# Patient Record
Sex: Female | Born: 1950
Health system: Southern US, Community
[De-identification: ages and names within clinical notes are randomized; demographics above are authoritative.]

## PROBLEM LIST (undated history)

## (undated) DIAGNOSIS — R079 Chest pain, unspecified: Secondary | ICD-10-CM

## (undated) DIAGNOSIS — F419 Anxiety disorder, unspecified: Secondary | ICD-10-CM

## (undated) DIAGNOSIS — M19049 Primary osteoarthritis, unspecified hand: Secondary | ICD-10-CM

## (undated) DIAGNOSIS — M8580 Other specified disorders of bone density and structure, unspecified site: Secondary | ICD-10-CM

## (undated) DIAGNOSIS — G43909 Migraine, unspecified, not intractable, without status migrainosus: Secondary | ICD-10-CM

## (undated) DIAGNOSIS — R0609 Other forms of dyspnea: Secondary | ICD-10-CM

## (undated) DIAGNOSIS — K219 Gastro-esophageal reflux disease without esophagitis: Secondary | ICD-10-CM

## (undated) DIAGNOSIS — Z973 Presence of spectacles and contact lenses: Secondary | ICD-10-CM

## (undated) DIAGNOSIS — Z98811 Dental restoration status: Secondary | ICD-10-CM

## (undated) DIAGNOSIS — E785 Hyperlipidemia, unspecified: Secondary | ICD-10-CM

## (undated) DIAGNOSIS — N880 Leukoplakia of cervix uteri: Secondary | ICD-10-CM

## (undated) DIAGNOSIS — I7300 Raynaud's syndrome without gangrene: Secondary | ICD-10-CM

## (undated) DIAGNOSIS — R87619 Unspecified abnormal cytological findings in specimens from cervix uteri: Secondary | ICD-10-CM

## (undated) DIAGNOSIS — R0600 Dyspnea, unspecified: Secondary | ICD-10-CM

## (undated) DIAGNOSIS — N907 Vulvar cyst: Secondary | ICD-10-CM

## (undated) DIAGNOSIS — H269 Unspecified cataract: Secondary | ICD-10-CM

## (undated) HISTORY — DX: Hyperlipidemia, unspecified: E78.5

## (undated) HISTORY — DX: Other forms of dyspnea: R06.09

## (undated) HISTORY — DX: Dyspnea, unspecified: R06.00

## (undated) HISTORY — DX: Vulvar cyst: N90.7

## (undated) HISTORY — PX: TONSILLECTOMY: SUR1361

## (undated) HISTORY — DX: Anxiety disorder, unspecified: F41.9

## (undated) HISTORY — DX: Other specified disorders of bone density and structure, unspecified site: M85.80

## (undated) HISTORY — DX: Raynaud's syndrome without gangrene: I73.00

## (undated) HISTORY — DX: Leukoplakia of cervix uteri: N88.0

## (undated) HISTORY — DX: Chest pain, unspecified: R07.9

## (undated) HISTORY — PX: OTOPLASTY: SUR998

## (undated) HISTORY — DX: Migraine, unspecified, not intractable, without status migrainosus: G43.909

## (undated) HISTORY — DX: Unspecified abnormal cytological findings in specimens from cervix uteri: R87.619

## (undated) HISTORY — PX: NASAL FRACTURE SURGERY: SHX718

---

## 1997-11-30 ENCOUNTER — Other Ambulatory Visit: Admission: RE | Admit: 1997-11-30 | Discharge: 1997-11-30 | Payer: Self-pay | Admitting: Obstetrics and Gynecology

## 1999-01-06 ENCOUNTER — Other Ambulatory Visit: Admission: RE | Admit: 1999-01-06 | Discharge: 1999-01-06 | Payer: Self-pay | Admitting: Obstetrics and Gynecology

## 1999-12-20 ENCOUNTER — Ambulatory Visit (HOSPITAL_COMMUNITY): Admission: RE | Admit: 1999-12-20 | Discharge: 1999-12-20 | Payer: Self-pay | Admitting: Gastroenterology

## 2000-02-05 ENCOUNTER — Other Ambulatory Visit: Admission: RE | Admit: 2000-02-05 | Discharge: 2000-02-05 | Payer: Self-pay | Admitting: Obstetrics and Gynecology

## 2000-02-26 ENCOUNTER — Encounter (INDEPENDENT_AMBULATORY_CARE_PROVIDER_SITE_OTHER): Payer: Self-pay

## 2000-02-26 ENCOUNTER — Other Ambulatory Visit: Admission: RE | Admit: 2000-02-26 | Discharge: 2000-02-26 | Payer: Self-pay | Admitting: Obstetrics and Gynecology

## 2000-07-10 ENCOUNTER — Other Ambulatory Visit: Admission: RE | Admit: 2000-07-10 | Discharge: 2000-07-10 | Payer: Self-pay | Admitting: Obstetrics and Gynecology

## 2000-10-08 ENCOUNTER — Other Ambulatory Visit: Admission: RE | Admit: 2000-10-08 | Discharge: 2000-10-08 | Payer: Self-pay | Admitting: Obstetrics and Gynecology

## 2001-03-12 ENCOUNTER — Other Ambulatory Visit: Admission: RE | Admit: 2001-03-12 | Discharge: 2001-03-12 | Payer: Self-pay | Admitting: Obstetrics and Gynecology

## 2002-03-25 ENCOUNTER — Other Ambulatory Visit: Admission: RE | Admit: 2002-03-25 | Discharge: 2002-03-25 | Payer: Self-pay | Admitting: Obstetrics and Gynecology

## 2002-07-08 ENCOUNTER — Encounter: Payer: Self-pay | Admitting: Obstetrics and Gynecology

## 2002-07-08 ENCOUNTER — Encounter: Admission: RE | Admit: 2002-07-08 | Discharge: 2002-07-08 | Payer: Self-pay | Admitting: Obstetrics and Gynecology

## 2002-09-16 ENCOUNTER — Encounter: Admission: RE | Admit: 2002-09-16 | Discharge: 2002-09-16 | Payer: Self-pay | Admitting: Obstetrics and Gynecology

## 2002-09-16 ENCOUNTER — Encounter: Payer: Self-pay | Admitting: Obstetrics and Gynecology

## 2003-04-07 ENCOUNTER — Other Ambulatory Visit: Admission: RE | Admit: 2003-04-07 | Discharge: 2003-04-07 | Payer: Self-pay | Admitting: Obstetrics and Gynecology

## 2003-11-03 ENCOUNTER — Encounter: Admission: RE | Admit: 2003-11-03 | Discharge: 2003-11-03 | Payer: Self-pay | Admitting: Obstetrics and Gynecology

## 2004-04-07 ENCOUNTER — Other Ambulatory Visit: Admission: RE | Admit: 2004-04-07 | Discharge: 2004-04-07 | Payer: Self-pay | Admitting: Obstetrics and Gynecology

## 2004-07-19 ENCOUNTER — Encounter
Admission: RE | Admit: 2004-07-19 | Discharge: 2004-07-19 | Payer: Self-pay | Admitting: Physical Medicine and Rehabilitation

## 2005-01-18 ENCOUNTER — Encounter: Admission: RE | Admit: 2005-01-18 | Discharge: 2005-01-18 | Payer: Self-pay | Admitting: Obstetrics and Gynecology

## 2005-08-10 ENCOUNTER — Other Ambulatory Visit: Admission: RE | Admit: 2005-08-10 | Discharge: 2005-08-10 | Payer: Self-pay | Admitting: Obstetrics and Gynecology

## 2006-03-27 ENCOUNTER — Encounter: Admission: RE | Admit: 2006-03-27 | Discharge: 2006-03-27 | Payer: Self-pay | Admitting: Obstetrics and Gynecology

## 2006-08-12 ENCOUNTER — Other Ambulatory Visit: Admission: RE | Admit: 2006-08-12 | Discharge: 2006-08-12 | Payer: Self-pay | Admitting: Obstetrics and Gynecology

## 2006-09-18 ENCOUNTER — Encounter: Admission: RE | Admit: 2006-09-18 | Discharge: 2006-09-18 | Payer: Self-pay | Admitting: Obstetrics and Gynecology

## 2006-11-07 ENCOUNTER — Encounter: Admission: RE | Admit: 2006-11-07 | Discharge: 2006-11-07 | Payer: Self-pay | Admitting: Internal Medicine

## 2007-08-18 ENCOUNTER — Other Ambulatory Visit: Admission: RE | Admit: 2007-08-18 | Discharge: 2007-08-18 | Payer: Self-pay | Admitting: Obstetrics and Gynecology

## 2007-10-08 ENCOUNTER — Encounter: Admission: RE | Admit: 2007-10-08 | Discharge: 2007-10-08 | Payer: Self-pay | Admitting: Obstetrics and Gynecology

## 2008-10-11 ENCOUNTER — Encounter: Admission: RE | Admit: 2008-10-11 | Discharge: 2008-10-11 | Payer: Self-pay | Admitting: Obstetrics and Gynecology

## 2008-11-11 ENCOUNTER — Encounter: Admission: RE | Admit: 2008-11-11 | Discharge: 2008-11-11 | Payer: Self-pay | Admitting: Obstetrics and Gynecology

## 2010-01-22 ENCOUNTER — Encounter: Payer: Self-pay | Admitting: Internal Medicine

## 2010-05-19 NOTE — Procedures (Signed)
[place]  Patient:    Rebecca, Turner                   MRN: [id number] Proc. Date: 12/20/99 Adm. Date:  [id number] Attending:  Charna Elizabeth CC:         Lennon Alstrom. Felipa Eth, M.D.   Procedure Report  DATE OF BIRTH:  [birth year].  REFERRING PHYSICIAN:  Ravi R. Felipa Eth, M.D.  PROCEDURE PERFORMED:  Colonoscopy.  ENDOSCOPIST:  Anselmo Rod, M.D.  INSTRUMENT USED:  Olympus video colonoscope.  INDICATIONS FOR PROCEDURE:  Guaiac positive stools with worsening constipation in a 60 year old white female rule out colonic polyps, masses, hemorrhoids, etc.  PREPROCEDURE PREPARATION:  Informed consent was procured from the patient. The patient was fasted for eight hours prior to the procedure and prepped with a bottle of magnesium citrate and a gallon of NuLytely the night prior to the procedure.  PREPROCEDURE PHYSICAL:  The patient had stable vital signs.  Neck supple. Chest clear to auscultation.  S1, S2 regular.  Abdomen soft with normal abdominal bowel sounds.  DESCRIPTION OF PROCEDURE:  The patient was placed in the left lateral decubitus position and sedated with 75 mg of Demerol and 7 mg of Versed intravenously.  Once the patient was adequately sedated and maintained on low-flow oxygen and continuous cardiac monitoring, the Olympus video colonoscope was advanced from the rectum to the cecum without difficulty.  The entire colonic mucosa appeared healthy.  Except for small internal and external hemorrhoids, no other abnormalities were seen.  No masses or polyps were present.  There was no evidence of diverticular disease.  IMPRESSION:  Normal colonoscopy except for small nonbleeding internal and external hemorrhoids.  RECOMMENDATIONS: 1. A high fiber diet has been recommended. 2. Outpatient follow-up is advised.  Repeat guaiac testing will be done and    further recommendations made as needed.DD:  12/20/99 TD:  12/20/99 Job:  [id number]

## 2012-01-08 ENCOUNTER — Other Ambulatory Visit: Payer: Self-pay | Admitting: Obstetrics & Gynecology

## 2012-01-08 DIAGNOSIS — Z1231 Encounter for screening mammogram for malignant neoplasm of breast: Secondary | ICD-10-CM

## 2012-02-11 ENCOUNTER — Ambulatory Visit
Admission: RE | Admit: 2012-02-11 | Discharge: 2012-02-11 | Disposition: A | Discharge status: Home / Self Care | Payer: 59 | Source: Ambulatory Visit | Attending: Obstetrics & Gynecology | Admitting: Obstetrics & Gynecology

## 2012-02-11 DIAGNOSIS — Z1231 Encounter for screening mammogram for malignant neoplasm of breast: Secondary | ICD-10-CM

## 2012-09-01 DIAGNOSIS — M19049 Primary osteoarthritis, unspecified hand: Secondary | ICD-10-CM

## 2012-09-01 HISTORY — DX: Primary osteoarthritis, unspecified hand: M19.049

## 2012-09-02 ENCOUNTER — Other Ambulatory Visit: Payer: Self-pay | Admitting: Orthopedic Surgery

## 2012-09-03 MED ORDER — DOPAMINE-DEXTROSE 3.2-5 MG/ML-% IV SOLN
INTRAVENOUS | Status: AC
  Filled 2012-09-03: qty 250

## 2012-09-05 ENCOUNTER — Encounter (HOSPITAL_BASED_OUTPATIENT_CLINIC_OR_DEPARTMENT_OTHER): Payer: Self-pay | Admitting: *Deleted

## 2012-09-10 NOTE — H&P (Signed)
  Rebecca Turner is an 62 y.o. female.   Chief Complaint: c/o chronic and progressive left thumb pain HPI: Rebecca Turner came for consult regarding her right thumb CMC discomfort.She has had history of increasing pain at the base of her right thumb during the past month.  She has no antecedent history of injury. She has undergone multiple injections into the thumb CMC joint without long lasting help.She now desires surgical intervention to help alleviate her predicament.      Past Medical History  Diagnosis Date  . Migraines   . GERD (gastroesophageal reflux disease)   . 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 Dental crowns present   . Cataract, immature     Past Surgical History  Procedure Laterality Date  . Tonsillectomy      as a child  . Nasal fracture surgery    . Otoplasty      as a child    History reviewed. No pertinent family history. Social History:  reports that she has never smoked. She has never used smokeless tobacco. She reports that  drinks alcohol. She reports that she does not use illicit drugs.  Allergies:  Allergies  Allergen Reactions  . Serzone [Nefazodone] Itching    No prescriptions prior to admission    No results found for this or any previous visit (from the past 48 hour(s)).  No results found.   Pertinent items are noted in HPI.  Height 5\' 3"  (1.6 m), weight 49.896 kg (110 lb).  General appearance: alert Head: Normocephalic, without obvious abnormality Neck: supple, symmetrical, trachea midline Resp: clear to auscultation bilaterally Cardio: regular rate and rhythm GI: normal findings: bowel sounds normal Extremities: Inspection of her hands reveals minor shouldering of her right thumb CMC joint.  She is noted to have 15 degrees hyperextension of her right thumb MP joint, 30 degrees hyperextension of her left thumb MP joint. She has further flexion to 40 degrees. She has  symmetrical IP motion.  She has pain on [facility] translation and grind.  No pain on palpation on palpation of the first dorsal compartment, negative Finkelstein's maneuver.  X-rays of her right thumb AP lateral and a tangential Concha Sudol view demonstrate [employer] stage III changes with less than one millimeter of joint space. She is developing marginal osteophytes.   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  Plan: To the OR for trapezium excision left hand with FCR transfer and release 1st DC.The procedure, risks,benefits and post-op course were discussed with the patient at length and they were in agreement with the plan.  DASNOIT,Rebecca Turner 09/10/2012, 4:03 PM   H&P documentation: 09/11/2012  -History and Physical Reviewed  -Patient has been re-examined  -No change in the plan of care  Wyn Forster, MD

## 2012-09-11 ENCOUNTER — Encounter (HOSPITAL_BASED_OUTPATIENT_CLINIC_OR_DEPARTMENT_OTHER): Payer: Self-pay | Admitting: *Deleted

## 2012-09-11 ENCOUNTER — Ambulatory Visit (HOSPITAL_BASED_OUTPATIENT_CLINIC_OR_DEPARTMENT_OTHER)
Admission: RE | Admit: 2012-09-11 | Discharge: 2012-09-11 | Disposition: A | Discharge status: Home / Self Care | Payer: 59 | Source: Ambulatory Visit | Attending: Orthopedic Surgery | Admitting: Orthopedic Surgery

## 2012-09-11 ENCOUNTER — Encounter (HOSPITAL_BASED_OUTPATIENT_CLINIC_OR_DEPARTMENT_OTHER)
Admission: RE | Disposition: A | Discharge status: Home / Self Care | Payer: Self-pay | Source: Ambulatory Visit | Attending: Orthopedic Surgery

## 2012-09-11 ENCOUNTER — Encounter (HOSPITAL_BASED_OUTPATIENT_CLINIC_OR_DEPARTMENT_OTHER): Payer: Self-pay | Admitting: Anesthesiology

## 2012-09-11 ENCOUNTER — Ambulatory Visit (HOSPITAL_BASED_OUTPATIENT_CLINIC_OR_DEPARTMENT_OTHER): Payer: 59 | Admitting: Anesthesiology

## 2012-09-11 DIAGNOSIS — M19049 Primary osteoarthritis, unspecified hand: Secondary | ICD-10-CM | POA: Insufficient documentation

## 2012-09-11 DIAGNOSIS — M65839 Other synovitis and tenosynovitis, unspecified forearm: Secondary | ICD-10-CM | POA: Insufficient documentation

## 2012-09-11 DIAGNOSIS — M79609 Pain in unspecified limb: Secondary | ICD-10-CM | POA: Insufficient documentation

## 2012-09-11 HISTORY — DX: Unspecified cataract: H26.9

## 2012-09-11 HISTORY — DX: Dental restoration status: Z98.811

## 2012-09-11 HISTORY — DX: Primary osteoarthritis, unspecified hand: M19.049

## 2012-09-11 HISTORY — DX: Gastro-esophageal reflux disease without esophagitis: K21.9

## 2012-09-11 HISTORY — PX: DORSAL COMPARTMENT RELEASE: SHX5039

## 2012-09-11 HISTORY — DX: Migraine, unspecified, not intractable, without status migrainosus: G43.909

## 2012-09-11 LAB — POCT HEMOGLOBIN-HEMACUE: Hemoglobin: 12.4 g/dL (ref 12.0–15.0)

## 2012-09-11 SURGERY — RELEASE, FIRST DORSAL COMPARTMENT, HAND
Anesthesia: Monitor Anesthesia Care | Site: Thumb | Laterality: Left | Wound class: Clean

## 2012-09-11 MED ORDER — MIDAZOLAM HCL 2 MG/2ML IJ SOLN: 1.0000 mg | INTRAMUSCULAR | Status: DC | PRN

## 2012-09-11 MED ORDER — PROPOFOL 10 MG/ML IV BOLUS
INTRAVENOUS | Status: DC | PRN
  Administered 2012-09-11: 20 mg via INTRAVENOUS

## 2012-09-11 MED ORDER — ROPIVACAINE HCL 5 MG/ML IJ SOLN
INTRAMUSCULAR | Status: DC | PRN
  Administered 2012-09-11: 30 mL via EPIDURAL

## 2012-09-11 MED ORDER — OXYCODONE HCL 5 MG/5ML PO SOLN: 5.0000 mg | Freq: Once | ORAL | Status: DC | PRN

## 2012-09-11 MED ORDER — MIDAZOLAM HCL 2 MG/ML PO SYRP: 12.0000 mg | ORAL_SOLUTION | Freq: Once | ORAL | Status: DC | PRN

## 2012-09-11 MED ORDER — CEPHALEXIN 500 MG PO CAPS: 500.0000 mg | ORAL_CAPSULE | Freq: Three times a day (TID) | ORAL | Status: DC

## 2012-09-11 MED ORDER — FENTANYL CITRATE 0.05 MG/ML IJ SOLN: 50.0000 ug | INTRAMUSCULAR | Status: DC | PRN

## 2012-09-11 MED ORDER — OXYCODONE HCL 5 MG PO TABS: 5.0000 mg | ORAL_TABLET | Freq: Once | ORAL | Status: DC | PRN

## 2012-09-11 MED ORDER — ONDANSETRON HCL 4 MG/2ML IJ SOLN
INTRAMUSCULAR | Status: DC | PRN
  Administered 2012-09-11: 4 mg via INTRAVENOUS

## 2012-09-11 MED ORDER — FENTANYL CITRATE 0.05 MG/ML IJ SOLN
50.0000 ug | INTRAMUSCULAR | Status: DC | PRN
  Administered 2012-09-11: 50 ug via INTRAVENOUS

## 2012-09-11 MED ORDER — PROPOFOL INFUSION 10 MG/ML OPTIME
INTRAVENOUS | Status: DC | PRN
  Administered 2012-09-11: 100 ug/kg/min via INTRAVENOUS

## 2012-09-11 MED ORDER — FENTANYL CITRATE 0.05 MG/ML IJ SOLN: 25.0000 ug | INTRAMUSCULAR | Status: DC | PRN

## 2012-09-11 MED ORDER — CEFAZOLIN SODIUM-DEXTROSE 2-3 GM-% IV SOLR
2.0000 g | INTRAVENOUS | Status: AC
  Administered 2012-09-11: 2 g via INTRAVENOUS

## 2012-09-11 MED ORDER — ONDANSETRON HCL 4 MG/2ML IJ SOLN: 4.0000 mg | Freq: Four times a day (QID) | INTRAMUSCULAR | Status: DC | PRN

## 2012-09-11 MED ORDER — CHLORHEXIDINE GLUCONATE 4 % EX LIQD: 60.0000 mL | Freq: Once | CUTANEOUS | Status: DC

## 2012-09-11 MED ORDER — MIDAZOLAM HCL 2 MG/2ML IJ SOLN
1.0000 mg | INTRAMUSCULAR | Status: DC | PRN
  Administered 2012-09-11: 1 mg via INTRAVENOUS

## 2012-09-11 MED ORDER — HYDROMORPHONE HCL 2 MG PO TABS: 2.0000 mg | ORAL_TABLET | ORAL | Status: DC | PRN

## 2012-09-11 MED ORDER — LACTATED RINGERS IV SOLN
INTRAVENOUS | Status: DC
  Administered 2012-09-11 (×2): via INTRAVENOUS

## 2012-09-11 SURGICAL SUPPLY — 71 items
BANDAGE ADHESIVE 1X3 (GAUZE/BANDAGES/DRESSINGS) IMPLANT
BANDAGE COBAN STERILE 2 (GAUZE/BANDAGES/DRESSINGS) ×1 IMPLANT
BANDAGE CONFORM 3  STR LF (GAUZE/BANDAGES/DRESSINGS) IMPLANT
BANDAGE ELASTIC 3 VELCRO ST LF (GAUZE/BANDAGES/DRESSINGS) IMPLANT
BANDAGE GAUZE ELAST BULKY 4 IN (GAUZE/BANDAGES/DRESSINGS) ×2 IMPLANT
BLADE MINI RND TIP GREEN BEAV (BLADE) IMPLANT
BLADE SURG 15 STRL LF DISP TIS (BLADE) ×1 IMPLANT
BLADE SURG 15 STRL SS (BLADE) ×2
BNDG CMPR 9X4 STRL LF SNTH (GAUZE/BANDAGES/DRESSINGS) ×1
BNDG CMPR MD 5X2 ELC HKLP STRL (GAUZE/BANDAGES/DRESSINGS)
BNDG ELASTIC 2 VLCR STRL LF (GAUZE/BANDAGES/DRESSINGS) IMPLANT
BNDG ESMARK 4X9 LF (GAUZE/BANDAGES/DRESSINGS) ×2 IMPLANT
BRUSH SCRUB EZ PLAIN DRY (MISCELLANEOUS) ×2 IMPLANT
BUR EGG/OVAL CARBIDE (BURR) IMPLANT
BUR FAST CUTTING MED (BURR) IMPLANT
CLOTH BEACON ORANGE TIMEOUT ST (SAFETY) ×2 IMPLANT
CORDS BIPOLAR (ELECTRODE) ×2 IMPLANT
COVER MAYO STAND STRL (DRAPES) ×2 IMPLANT
COVER TABLE BACK 60X90 (DRAPES) ×2 IMPLANT
CUFF TOURNIQUET SINGLE 18IN (TOURNIQUET CUFF) ×1 IMPLANT
DECANTER SPIKE VIAL GLASS SM (MISCELLANEOUS) IMPLANT
DRAPE EXTREMITY T 121X128X90 (DRAPE) ×2 IMPLANT
DRAPE OEC MINIVIEW 54X84 (DRAPES) ×2 IMPLANT
DRAPE SURG 17X23 STRL (DRAPES) ×2 IMPLANT
GAUZE XEROFORM 1X8 LF (GAUZE/BANDAGES/DRESSINGS) IMPLANT
GLOVE BIO SURGEON STRL SZ 6.5 (GLOVE) ×1 IMPLANT
GLOVE BIOGEL M STRL SZ7.5 (GLOVE) ×2 IMPLANT
GLOVE BIOGEL PI IND STRL 7.0 (GLOVE) IMPLANT
GLOVE BIOGEL PI IND STRL 8 (GLOVE) ×1 IMPLANT
GLOVE BIOGEL PI INDICATOR 7.0 (GLOVE) ×2
GLOVE BIOGEL PI INDICATOR 8 (GLOVE) ×1
GLOVE ORTHO TXT STRL SZ7.5 (GLOVE) ×2 IMPLANT
GOWN BRE IMP PREV XXLGXLNG (GOWN DISPOSABLE) ×4 IMPLANT
GOWN PREVENTION PLUS XLARGE (GOWN DISPOSABLE) ×2 IMPLANT
K-WIRE .035X4 (WIRE) IMPLANT
K-WIRE 4.0X.028 (WIRE) IMPLANT
LOOP VESSEL MAXI BLUE (MISCELLANEOUS) IMPLANT
NDL HYPO 25X1 1.5 SAFETY (NEEDLE) IMPLANT
NEEDLE 27GAX1X1/2 (NEEDLE) ×1 IMPLANT
NEEDLE HYPO 25X1 1.5 SAFETY (NEEDLE) IMPLANT
NS IRRIG 1000ML POUR BTL (IV SOLUTION) ×2 IMPLANT
PACK BASIN DAY SURGERY FS (CUSTOM PROCEDURE TRAY) ×2 IMPLANT
PAD CAST 3X4 CTTN HI CHSV (CAST SUPPLIES) IMPLANT
PADDING CAST ABS 4INX4YD NS (CAST SUPPLIES) ×1
PADDING CAST ABS COTTON 4X4 ST (CAST SUPPLIES) ×1 IMPLANT
PADDING CAST COTTON 3X4 STRL (CAST SUPPLIES) ×2
PADDING UNDERCAST 2  STERILE (CAST SUPPLIES) ×1 IMPLANT
SLEEVE SCD COMPRESS KNEE MED (MISCELLANEOUS) ×1 IMPLANT
SPLINT PLASTER CAST XFAST 3X15 (CAST SUPPLIES) IMPLANT
SPLINT PLASTER XTRA FASTSET 3X (CAST SUPPLIES) ×9
SPONGE GAUZE 4X4 12PLY (GAUZE/BANDAGES/DRESSINGS) ×2 IMPLANT
STOCKINETTE 4X48 STRL (DRAPES) ×2 IMPLANT
STRIP CLOSURE SKIN 1/2X4 (GAUZE/BANDAGES/DRESSINGS) ×1 IMPLANT
SUT ETHILON 5 0 P 3 18 (SUTURE)
SUT FIBERWIRE 3-0 18 TAPR NDL (SUTURE)
SUT MERSILENE 4 0 P 3 (SUTURE) IMPLANT
SUT NYLON ETHILON 5-0 P-3 1X18 (SUTURE) IMPLANT
SUT PROLENE 3 0 PS 2 (SUTURE) IMPLANT
SUT PROLENE 4 0 P 3 18 (SUTURE) ×2 IMPLANT
SUT STEEL 0 (SUTURE)
SUT STEEL 0 18XMFL TIE 17 (SUTURE) IMPLANT
SUT VIC AB 4-0 P-3 18XBRD (SUTURE) IMPLANT
SUT VIC AB 4-0 P3 18 (SUTURE) ×4
SUTURE FIBERWR 3-0 18 TAPR NDL (SUTURE) IMPLANT
SYR 3ML 23GX1 SAFETY (SYRINGE) IMPLANT
SYR BULB 3OZ (MISCELLANEOUS) ×2 IMPLANT
SYR CONTROL 10ML LL (SYRINGE) ×2 IMPLANT
TOWEL OR 17X24 6PK STRL BLUE (TOWEL DISPOSABLE) ×4 IMPLANT
TRAY DSU PREP LF (CUSTOM PROCEDURE TRAY) ×2 IMPLANT
TUBE CONNECTING 20X1/4 (TUBING) ×1 IMPLANT
UNDERPAD 30X30 INCONTINENT (UNDERPADS AND DIAPERS) ×2 IMPLANT

## 2012-09-11 NOTE — Transfer of Care (Signed)
Immediate Anesthesia Transfer of Care Note  Patient: Rebecca Turner  Procedure(s) Performed: Procedure(s): TRAPEZIUM EXCISION FOR TRANSFER FIRST DORSAL RELEASE (Left)  Patient Location: PACU  Anesthesia Type:MAC combined with regional for post-op pain  Level of Consciousness: awake, alert  and oriented  Airway & Oxygen Therapy: Patient Spontanous Breathing and Patient connected to face mask oxygen  Post-op Assessment: Report given to PACU RN and Post -op Vital signs reviewed and stable  Post vital signs: Reviewed and stable  Complications: No apparent anesthesia complications

## 2012-09-11 NOTE — Progress Notes (Signed)
Assisted Dr. Hodierne with left, ultrasound guided, supraclavicular block. Side rails up, monitors on throughout procedure. See vital signs in flow sheet. Tolerated Procedure well. 

## 2012-09-11 NOTE — Brief Op Note (Signed)
09/11/2012  12:07 PM  PATIENT:  Rebecca Turner  62 y.o. female  PRE-OPERATIVE DIAGNOSIS:  LEFT THUMB CMC DJD  POST-OPERATIVE DIAGNOSIS:  Left thumb Carpalmetacarpal Degenerative Joint Disease  PROCEDURE: TRAPEZIUM EXCISION. PALMARIS LONGUS TENODESIS OF FLEXOR CARPI RADIALIS AND PALMAR SLIP OF ABDUCTOR POLICUS LONGUS  SURGEON:  Surgeon(s) and Role:    * Wyn Forster., MD - Primary  PHYSICIAN ASSISTANT:   ASSISTANTS: Mallory Shirk.A-C    ANESTHESIA:   general  EBL:  Total I/O In: 300 [I.V.:300] Out: -   BLOOD ADMINISTERED:none  DRAINS: none   LOCAL MEDICATIONS USED:  XYLOCAINE   SPECIMEN:  No Specimen  DISPOSITION OF SPECIMEN:  N/A  COUNTS:  YES  TOURNIQUET:  * Missing tourniquet times found for documented tourniquets in log:  [id number] *  DICTATION: .Other Dictation: Dictation Number [id number]  PLAN OF CARE: Discharge to home after PACU  PATIENT DISPOSITION:  PACU - hemodynamically stable.   Delay start of Pharmacological VTE agent (>24hrs) due to surgical blood loss or risk of bleeding: not applicable

## 2012-09-11 NOTE — Op Note (Signed)
[id number] 

## 2012-09-11 NOTE — Anesthesia Procedure Notes (Signed)
Anesthesia Regional Block:  Supraclavicular block  Pre-Anesthetic Checklist: ,, timeout performed, Correct Patient, Correct Site, Correct Laterality, Correct Procedure, Correct Position, site marked, Risks and benefits discussed,  Surgical consent,  Pre-op evaluation,  At surgeon's request and post-op pain management  Laterality: Left  Prep: chloraprep       Needles:  Injection technique: Single-shot  Needle Type: Echogenic Stimulator Needle     Needle Length: 5cm 5 cm Needle Gauge: 22 and 22 G    Additional Needles:  Procedures: ultrasound guided (picture in chart) and nerve stimulator Supraclavicular block  Nerve Stimulator or Paresthesia:  Response: biceps flexion, 0.45 mA,   Additional Responses:   Narrative:  Start time: 09/11/2012 10:16 AM End time: 09/11/2012 10:23 AM Injection made incrementally with aspirations every 5 mL.  Performed by: Personally  Anesthesiologist: Dr Chaney Malling  Additional Notes: Functioning IV was confirmed and monitors were applied.  A 50mm 22ga Arrow echogenic stimulator needle was used. Sterile prep and drape,hand hygiene and sterile gloves were used.  Negative aspiration and negative test dose prior to incremental administration of local anesthetic. The patient tolerated the procedure well.  Ultrasound guidance: relevent anatomy identified, needle position confirmed, local anesthetic spread visualized around nerve(s), vascular puncture avoided.  Image printed for medical record.   Supraclavicular block

## 2012-09-11 NOTE — Anesthesia Preprocedure Evaluation (Signed)
Anesthesia Evaluation  Patient identified by MRN, date of birth, ID band Patient awake    Reviewed: Allergy & Precautions, H&P , NPO status , Patient's Chart, lab work & pertinent test results  Airway Mallampati: II  Neck ROM: full    Dental   Pulmonary          Cardiovascular     Neuro/Psych  Headaches,    GI/Hepatic GERD-  ,  Endo/Other    Renal/GU      Musculoskeletal  (+) Arthritis -,   Abdominal   Peds  Hematology   Anesthesia Other Findings   Reproductive/Obstetrics                           Anesthesia Physical Anesthesia Plan  ASA: II  Anesthesia Plan: General and Regional   Post-op Pain Management: MAC Combined w/ Regional for Post-op pain   Induction: Intravenous  Airway Management Planned: LMA  Additional Equipment:   Intra-op Plan:   Post-operative Plan:   Informed Consent: I have reviewed the patients History and Physical, chart, labs and discussed the procedure including the risks, benefits and alternatives for the proposed anesthesia with the patient or authorized representative who has indicated his/her understanding and acceptance.     Plan Discussed with: CRNA, Anesthesiologist and Surgeon  Anesthesia Plan Comments:         Anesthesia Quick Evaluation

## 2012-09-11 NOTE — Op Note (Signed)
NAMEMAKYLEE, SANBORN NO.:  [id number]  MEDICAL RECORD NO.:  [id number]  LOCATION:                               FACILITY:  [facility]  PHYSICIAN:  Katy Fitch. Maleka Contino, M.D. DATE OF BIRTH:  1950/10/02  DATE OF PROCEDURE:  09/11/2012 DATE OF DISCHARGE:  09/11/2012                              OPERATIVE REPORT   PREOPERATIVE DIAGNOSES:  Bone-on-bone arthropathy, left thumb carpometacarpal joint with moderate stenosing tenosynovitis symptoms, left first dorsal compartment.  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  OPERATIONS: 1. Resection of left trapezium. 2. Tenodesis between the flexor carpi radialis and palmar slip of     abductor pollicis longus to create interposition arthroplasty in a     modified manner of Weilby. 3. Also, transfer of the abductor pollicis longus to thenar muscles to     increase palmar abduction of the thumb.  OPERATING SURGEON:  Katy Fitch. Cordale Manera, MD  ASSISTANT:  Marveen Reeks Dasnoit, PA  ANESTHESIA:  General by LMA.  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  We have managed her symptoms for a quite while with splinting activity, modification, anti-inflammatory medications, as well as steroid injection.  Due to failure to obtain long-term pain relief, she now returns, requesting [facility] reconstruction.  We discussed a series of opportunities to reconstruct the [facility] joint. She understands that trapezium resection, debridement, and tendon interposition arthroplasty is the gold standard for this operation.  We advised her that we would proceed with a tenodesis of the abductor pollicis longus and  flexor carpi radialis in the manner of Weilby.  After detailed discussion, she decided to proceed with left-sided surgery at this time.  Preoperatively, she was interviewed by Dr. Chaney Malling of Anesthesia, recommended general anesthesia by LMA technique versus a regional block.  Ms. Enright ultimately accepted a regional block.  Dr. Chaney Malling placed a plexus block with ultrasound control without complication, leading to excellent anesthesia of the left arm.  DESCRIPTION OF PROCEDURE:  Rebbie Lauricella was brought to room 2 of the [facility] and placed in supine position upon the operating table.  Following IV sedation, her left arm and hand were prepped with Betadine soap and solution, sterilely draped.  A 2 g of Ancef administered as IV prophylactic antibiotic.  The left hand and arm were prepped with Betadine soap and solution, sterilely draped.  Following exsanguination of the left arm with Esmarch bandage, arterial tourniquet was inflated to 230 mmHg due to mild systolic hypertension.  Following routine surgical time-out, procedure commenced with a longitudinal incision over the dorsal aspect of the thumb carpometacarpal joint.  Subcutaneous tissues were carefully divided, taking care to identify and spare the radial superficial sensory branches.  The abductor pollicis longus and extensor pollicis longus tendons were identified as was the radial artery.  A capsulotomy was performed over the [facility] joint and subperiosteal resection of the trapezium was accomplished with sharp osteotome and rongeurs.  The flexor  carpi radialis was preserved, but found to be a very small caliber.  Ms. Casebeer had a palmaris longus tendon, therefore we elected to proceed with harvest of the palmaris longus through short transverse incision at the distal wrist flexion crease with a brown tendon stripper.  This was accomplished with standard technique, taking care to protect the palmar  cutaneous branch of the median nerve.  This tendon was then used to tenodesed the palmar slip of the abductor pollicis longus to the flexor carpi radialis invaginating both tendons deep to the thumb metacarpal while placing traction on the metacarpal. Care was taken to avoid filing the primary slip of the abductor pollicis longus.  After the tenodesis was completed, a very satisfactory knot of multiple tendon passes was created beneath the thumb metacarpal, creating an excellent interposition arthroplasty.  There was some slack in the primary slip of the abductor pollicis longus which was then advanced and secured to the thenar musculature with multiple interrupted sutures of 3-0 Ethibond.  The wound was then inspected for bleeding points followed by repair of the skin with subcutaneous 4-0 Vicryl and intradermal 3-0 Prolene.  A compressive dressing was applied with a thumb spica splint, maintaining the thumb in a position of palmar abduction.  Ms. Germano tolerated the surgery and anesthesia well.  She was transferred to the recovery room with stable signs.  For aftercare, she will be provided prescriptions for Dilaudid 2 mg 1 or 2 tablets p.o. q.4- 6 hours p.r.n. pain, 30 tablets without refill, also Keflex 500 mg 1 p.o. q.8 hours x4 days as a prophylactic antibiotic.     Katy Fitch Adriella Essex, M.D.   ______________________________ Katy Fitch. Yasha Tibbett, M.D.    RVS/MEDQ  D:  09/11/2012  T:  09/11/2012  Job:  [id number]

## 2012-09-11 NOTE — Anesthesia Postprocedure Evaluation (Signed)
Anesthesia Post Note  Patient: Rebecca Turner  Procedure(s) Performed: Procedure(s) (LRB): TRAPEZIUM EXCISION FOR TRANSFER FIRST DORSAL RELEASE (Left)  Anesthesia type: MAC  Patient location: PACU  Post pain: Pain level controlled and Adequate analgesia  Post assessment: Post-op Vital signs reviewed, Patient's Cardiovascular Status Stable and Respiratory Function Stable  Last Vitals:  Filed Vitals:   09/11/12 1245  BP: 122/70  Pulse: 67  Temp:   Resp: 16    Post vital signs: Reviewed and stable  Level of consciousness: awake, alert  and oriented  Complications: No apparent anesthesia complications

## 2012-09-12 ENCOUNTER — Encounter (HOSPITAL_BASED_OUTPATIENT_CLINIC_OR_DEPARTMENT_OTHER): Payer: Self-pay | Admitting: Orthopedic Surgery

## 2012-09-26 ENCOUNTER — Encounter: Payer: Self-pay | Admitting: Obstetrics & Gynecology

## 2012-10-29 ENCOUNTER — Ambulatory Visit: Payer: 59 | Attending: Orthopedic Surgery | Admitting: Occupational Therapy

## 2012-10-29 DIAGNOSIS — M2560 Stiffness of unspecified joint, not elsewhere classified: Secondary | ICD-10-CM | POA: Insufficient documentation

## 2012-10-29 DIAGNOSIS — IMO0001 Reserved for inherently not codable concepts without codable children: Secondary | ICD-10-CM | POA: Insufficient documentation

## 2012-10-29 DIAGNOSIS — M25549 Pain in joints of unspecified hand: Secondary | ICD-10-CM | POA: Insufficient documentation

## 2012-11-06 ENCOUNTER — Ambulatory Visit: Payer: 59 | Attending: Orthopedic Surgery | Admitting: Occupational Therapy

## 2012-11-06 DIAGNOSIS — IMO0001 Reserved for inherently not codable concepts without codable children: Secondary | ICD-10-CM | POA: Insufficient documentation

## 2012-11-06 DIAGNOSIS — M2560 Stiffness of unspecified joint, not elsewhere classified: Secondary | ICD-10-CM | POA: Insufficient documentation

## 2012-11-06 DIAGNOSIS — M25549 Pain in joints of unspecified hand: Secondary | ICD-10-CM | POA: Insufficient documentation

## 2012-11-20 ENCOUNTER — Ambulatory Visit: Payer: 59 | Admitting: Occupational Therapy

## 2012-11-26 ENCOUNTER — Ambulatory Visit: Payer: 59 | Admitting: Occupational Therapy

## 2012-12-03 ENCOUNTER — Encounter: Payer: 59 | Admitting: Occupational Therapy

## 2012-12-10 ENCOUNTER — Ambulatory Visit: Payer: 59 | Admitting: Occupational Therapy

## 2012-12-16 ENCOUNTER — Other Ambulatory Visit: Payer: Self-pay | Admitting: Orthopedic Surgery

## 2012-12-17 ENCOUNTER — Ambulatory Visit: Payer: 59 | Attending: Orthopedic Surgery | Admitting: Occupational Therapy

## 2012-12-17 DIAGNOSIS — M25549 Pain in joints of unspecified hand: Secondary | ICD-10-CM | POA: Insufficient documentation

## 2012-12-17 DIAGNOSIS — IMO0001 Reserved for inherently not codable concepts without codable children: Secondary | ICD-10-CM | POA: Insufficient documentation

## 2012-12-17 DIAGNOSIS — M2560 Stiffness of unspecified joint, not elsewhere classified: Secondary | ICD-10-CM | POA: Insufficient documentation

## 2012-12-19 ENCOUNTER — Encounter (HOSPITAL_BASED_OUTPATIENT_CLINIC_OR_DEPARTMENT_OTHER): Payer: Self-pay | Admitting: *Deleted

## 2012-12-19 NOTE — Progress Notes (Signed)
Pt had her lt cmc 9/14-went home-did well-no labs needed-even though bed requested-will probably go home again

## 2012-12-22 NOTE — H&P (Signed)
  Rebecca HELSER is an 62 y.o. female.   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.   She has had history of increasing pain at the base of her right thumb during the past month.  She has no antecedent history of injury. She has undergone numerous injections which are no longer effective in relieving her pain. She underwent surgery to the left hand thumb CMC joint in September of 2014 and has had excellent short-term post-op relief. She now requests surgical intervention to the right hand .  Past Medical History  Diagnosis Date  . Migraines   . GERD (gastroesophageal reflux disease)   . 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 Dental crowns present   . Cataract, immature   . Wears glasses     Past Surgical History  Procedure Laterality Date  . Tonsillectomy      as a child  . Nasal fracture surgery    . Otoplasty      as a child  . Dorsal compartment release Left 09/11/2012    Procedure: TRAPEZIUM EXCISION FOR TRANSFER FIRST DORSAL RELEASE;  Surgeon: Wyn Forster., MD;  Location: [facility];  Service: Orthopedics;  Laterality: Left;    History reviewed. No pertinent family history. Social History:  reports that she has never smoked. She has never used smokeless tobacco. She reports that she drinks alcohol. She reports that she does not use illicit drugs.  Allergies:  Allergies  Allergen Reactions  . Serzone [Nefazodone] Itching    No prescriptions prior to admission    No results found for this or any previous visit (from the past 48 hour(s)).  No results found.   Pertinent items are noted in HPI.  Height 5\' 3"  (1.6 m), weight 49.896 kg (110 lb).  General appearance: alert Head: Normocephalic, without obvious abnormality Neck: supple, symmetrical, trachea midline Resp:  clear to auscultation bilaterally Cardio: regular rate and rhythm GI: normal findings: bowel sounds normal Extremities:.  She has pain on [facility] translation and grind. Xrays reveal Eaton stage III CMC arthrosis. 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  DASNOIT,Kiela Shisler J 12/22/2012, 2:34 PM  H&P documentation: 12/23/2012  -History and Physical Reviewed  -Patient has been re-examined  -No change in the plan of care  Wyn Forster, MD

## 2012-12-23 ENCOUNTER — Encounter (HOSPITAL_BASED_OUTPATIENT_CLINIC_OR_DEPARTMENT_OTHER): Payer: Self-pay | Admitting: *Deleted

## 2012-12-23 ENCOUNTER — Ambulatory Visit (HOSPITAL_BASED_OUTPATIENT_CLINIC_OR_DEPARTMENT_OTHER)
Admission: RE | Admit: 2012-12-23 | Discharge: 2012-12-23 | Disposition: A | Discharge status: Home / Self Care | Payer: 59 | Source: Ambulatory Visit | Attending: Orthopedic Surgery | Admitting: Orthopedic Surgery

## 2012-12-23 ENCOUNTER — Encounter (HOSPITAL_BASED_OUTPATIENT_CLINIC_OR_DEPARTMENT_OTHER)
Admission: RE | Disposition: A | Discharge status: Home / Self Care | Payer: Self-pay | Source: Ambulatory Visit | Attending: Orthopedic Surgery

## 2012-12-23 ENCOUNTER — Ambulatory Visit (HOSPITAL_BASED_OUTPATIENT_CLINIC_OR_DEPARTMENT_OTHER): Payer: 59 | Admitting: Anesthesiology

## 2012-12-23 ENCOUNTER — Encounter (HOSPITAL_BASED_OUTPATIENT_CLINIC_OR_DEPARTMENT_OTHER): Payer: 59 | Admitting: Anesthesiology

## 2012-12-23 DIAGNOSIS — M19049 Primary osteoarthritis, unspecified hand: Secondary | ICD-10-CM | POA: Insufficient documentation

## 2012-12-23 HISTORY — PX: CARPOMETACARPEL SUSPENSION PLASTY: SHX5005

## 2012-12-23 HISTORY — DX: Presence of spectacles and contact lenses: Z97.3

## 2012-12-23 LAB — POCT HEMOGLOBIN-HEMACUE: Hemoglobin: 13.2 g/dL (ref 12.0–15.0)

## 2012-12-23 SURGERY — CARPOMETACARPEL (CMC) SUSPENSION PLASTY
Anesthesia: General | Site: Hand | Laterality: Right

## 2012-12-23 MED ORDER — FENTANYL CITRATE 0.05 MG/ML IJ SOLN
INTRAMUSCULAR | Status: AC
  Filled 2012-12-23: qty 2

## 2012-12-23 MED ORDER — MIDAZOLAM HCL 2 MG/2ML IJ SOLN
1.0000 mg | INTRAMUSCULAR | Status: DC | PRN
  Administered 2012-12-23: 2 mg via INTRAVENOUS

## 2012-12-23 MED ORDER — OXYCODONE HCL 5 MG/5ML PO SOLN: 5.0000 mg | Freq: Once | ORAL | Status: DC | PRN

## 2012-12-23 MED ORDER — DEXAMETHASONE SODIUM PHOSPHATE 4 MG/ML IJ SOLN
INTRAMUSCULAR | Status: DC | PRN
  Administered 2012-12-23: 4 mg via PERINEURAL

## 2012-12-23 MED ORDER — LACTATED RINGERS IV SOLN
INTRAVENOUS | Status: DC
  Administered 2012-12-23 (×2): via INTRAVENOUS

## 2012-12-23 MED ORDER — MIDAZOLAM HCL 2 MG/2ML IJ SOLN
INTRAMUSCULAR | Status: AC
  Filled 2012-12-23: qty 2

## 2012-12-23 MED ORDER — ONDANSETRON HCL 4 MG/2ML IJ SOLN
INTRAMUSCULAR | Status: DC | PRN
  Administered 2012-12-23: 4 mg via INTRAVENOUS

## 2012-12-23 MED ORDER — DEXAMETHASONE SODIUM PHOSPHATE 10 MG/ML IJ SOLN
INTRAMUSCULAR | Status: DC | PRN
  Administered 2012-12-23: 5 mg via INTRAVENOUS

## 2012-12-23 MED ORDER — PROPOFOL 10 MG/ML IV BOLUS
INTRAVENOUS | Status: DC | PRN
  Administered 2012-12-23: 160 mg via INTRAVENOUS
  Administered 2012-12-23: 40 mg via INTRAVENOUS

## 2012-12-23 MED ORDER — HYDROMORPHONE HCL PF 1 MG/ML IJ SOLN: 0.2500 mg | INTRAMUSCULAR | Status: DC | PRN

## 2012-12-23 MED ORDER — CEFAZOLIN SODIUM-DEXTROSE 2-3 GM-% IV SOLR
INTRAVENOUS | Status: AC
  Filled 2012-12-23: qty 50

## 2012-12-23 MED ORDER — CEFAZOLIN SODIUM-DEXTROSE 2-3 GM-% IV SOLR
INTRAVENOUS | Status: DC | PRN
  Administered 2012-12-23: 2 g via INTRAVENOUS

## 2012-12-23 MED ORDER — ROPIVACAINE HCL 5 MG/ML IJ SOLN
INTRAMUSCULAR | Status: DC | PRN
  Administered 2012-12-23: 30 mL via PERINEURAL

## 2012-12-23 MED ORDER — CEPHALEXIN 500 MG PO CAPS: 500.0000 mg | ORAL_CAPSULE | Freq: Three times a day (TID) | ORAL | Status: DC

## 2012-12-23 MED ORDER — HYDROMORPHONE HCL 2 MG PO TABS: 2.0000 mg | ORAL_TABLET | ORAL | Status: DC | PRN

## 2012-12-23 MED ORDER — OXYCODONE HCL 5 MG PO TABS: 5.0000 mg | ORAL_TABLET | Freq: Once | ORAL | Status: DC | PRN

## 2012-12-23 MED ORDER — ONDANSETRON HCL 4 MG/2ML IJ SOLN: 4.0000 mg | Freq: Once | INTRAMUSCULAR | Status: DC | PRN

## 2012-12-23 MED ORDER — FENTANYL CITRATE 0.05 MG/ML IJ SOLN
50.0000 ug | INTRAMUSCULAR | Status: DC | PRN
  Administered 2012-12-23: 50 ug via INTRAVENOUS

## 2012-12-23 MED ORDER — CHLORHEXIDINE GLUCONATE 4 % EX LIQD: 60.0000 mL | Freq: Once | CUTANEOUS | Status: DC

## 2012-12-23 MED ORDER — LIDOCAINE HCL (CARDIAC) 20 MG/ML IV SOLN
INTRAVENOUS | Status: DC | PRN
  Administered 2012-12-23: 80 mg via INTRAVENOUS

## 2012-12-23 MED ORDER — MEPERIDINE HCL 25 MG/ML IJ SOLN: 6.2500 mg | INTRAMUSCULAR | Status: DC | PRN

## 2012-12-23 SURGICAL SUPPLY — 69 items
BAG DECANTER FOR FLEXI CONT (MISCELLANEOUS) IMPLANT
BANDAGE ADH SHEER 1  50/CT (GAUZE/BANDAGES/DRESSINGS) IMPLANT
BANDAGE COBAN STERILE 2 (GAUZE/BANDAGES/DRESSINGS) IMPLANT
BANDAGE ELASTIC 3 VELCRO ST LF (GAUZE/BANDAGES/DRESSINGS) ×4 IMPLANT
BLADE MINI RND TIP GREEN BEAV (BLADE) ×2 IMPLANT
BLADE SURG 15 STRL LF DISP TIS (BLADE) ×1 IMPLANT
BLADE SURG 15 STRL SS (BLADE) ×2
BNDG CMPR 9X4 STRL LF SNTH (GAUZE/BANDAGES/DRESSINGS) ×1
BNDG CMPR MD 5X2 ELC HKLP STRL (GAUZE/BANDAGES/DRESSINGS)
BNDG COHESIVE 3X5 TAN STRL LF (GAUZE/BANDAGES/DRESSINGS) ×1 IMPLANT
BNDG ELASTIC 2 VLCR STRL LF (GAUZE/BANDAGES/DRESSINGS) IMPLANT
BNDG ESMARK 4X9 LF (GAUZE/BANDAGES/DRESSINGS) ×2 IMPLANT
BNDG GAUZE ELAST 4 BULKY (GAUZE/BANDAGES/DRESSINGS) ×4 IMPLANT
BRUSH SCRUB EZ PLAIN DRY (MISCELLANEOUS) ×2 IMPLANT
CORDS BIPOLAR (ELECTRODE) ×2 IMPLANT
COVER MAYO STAND STRL (DRAPES) ×2 IMPLANT
COVER TABLE BACK 60X90 (DRAPES) ×2 IMPLANT
CUFF TOURNIQUET SINGLE 18IN (TOURNIQUET CUFF) ×1 IMPLANT
DECANTER SPIKE VIAL GLASS SM (MISCELLANEOUS) IMPLANT
DRAPE EXTREMITY T 121X128X90 (DRAPE) ×2 IMPLANT
DRAPE OEC MINIVIEW 54X84 (DRAPES) IMPLANT
DRAPE SURG 17X23 STRL (DRAPES) ×2 IMPLANT
GLOVE BIO SURGEON STRL SZ7 (GLOVE) ×2 IMPLANT
GLOVE BIOGEL M STRL SZ7.5 (GLOVE) ×2 IMPLANT
GLOVE BIOGEL PI IND STRL 7.0 (GLOVE) IMPLANT
GLOVE BIOGEL PI INDICATOR 7.0 (GLOVE) ×1
GLOVE ECLIPSE 6.5 STRL STRAW (GLOVE) ×1 IMPLANT
GLOVE ORTHO TXT STRL SZ7.5 (GLOVE) ×2 IMPLANT
GOWN BRE IMP PREV XXLGXLNG (GOWN DISPOSABLE) ×4 IMPLANT
GOWN PREVENTION PLUS XLARGE (GOWN DISPOSABLE) ×2 IMPLANT
GOWN PREVENTION PLUS XXLARGE (GOWN DISPOSABLE) ×2 IMPLANT
NDL ADDISON D1/2 CIR (NEEDLE) IMPLANT
NDL KEITH (NEEDLE) IMPLANT
NDL SUT 6 .5 CRC .975X.05 MAYO (NEEDLE) ×1 IMPLANT
NEEDLE 27GAX1X1/2 (NEEDLE) IMPLANT
NEEDLE ADDISON D1/2 CIR (NEEDLE) IMPLANT
NEEDLE FISTULA 1/2 CIRCLE (NEEDLE) IMPLANT
NEEDLE KEITH (NEEDLE) IMPLANT
NEEDLE MAYO TAPER (NEEDLE) ×2
NS IRRIG 1000ML POUR BTL (IV SOLUTION) ×2 IMPLANT
PACK BASIN DAY SURGERY FS (CUSTOM PROCEDURE TRAY) ×2 IMPLANT
PAD CAST 3X4 CTTN HI CHSV (CAST SUPPLIES) IMPLANT
PADDING CAST ABS 4INX4YD NS (CAST SUPPLIES) ×1
PADDING CAST ABS COTTON 4X4 ST (CAST SUPPLIES) ×1 IMPLANT
PADDING CAST COTTON 3X4 STRL (CAST SUPPLIES)
PADDING UNDERCAST 2  STERILE (CAST SUPPLIES) IMPLANT
PASSER SUT SWANSON 36MM LOOP (INSTRUMENTS) ×2 IMPLANT
SLEEVE SCD COMPRESS KNEE MED (MISCELLANEOUS) ×1 IMPLANT
SPLINT PLASTER CAST XFAST 3X15 (CAST SUPPLIES) IMPLANT
SPLINT PLASTER XTRA FASTSET 3X (CAST SUPPLIES) ×10
SPONGE GAUZE 4X4 12PLY (GAUZE/BANDAGES/DRESSINGS) ×2 IMPLANT
STOCKINETTE 4X48 STRL (DRAPES) ×2 IMPLANT
STRIP CLOSURE SKIN 1/2X4 (GAUZE/BANDAGES/DRESSINGS) ×2 IMPLANT
SUT ETHIBOND 3-0 V-5 (SUTURE) ×2 IMPLANT
SUT FIBERWIRE #2 38 T-5 BLUE (SUTURE)
SUT FIBERWIRE 2-0 18 17.9 3/8 (SUTURE)
SUT FIBERWIRE 3-0 18 TAPR NDL (SUTURE)
SUT PROLENE 3 0 PS 2 (SUTURE) ×2 IMPLANT
SUT VIC AB 3-0 FS2 27 (SUTURE) IMPLANT
SUT VIC AB 4-0 P-3 18XBRD (SUTURE) IMPLANT
SUT VIC AB 4-0 P3 18 (SUTURE)
SUTURE FIBERWR #2 38 T-5 BLUE (SUTURE) IMPLANT
SUTURE FIBERWR 2-0 18 17.9 3/8 (SUTURE) IMPLANT
SUTURE FIBERWR 3-0 18 TAPR NDL (SUTURE) IMPLANT
SYR 3ML 23GX1 SAFETY (SYRINGE) IMPLANT
SYR BULB 3OZ (MISCELLANEOUS) ×2 IMPLANT
SYR CONTROL 10ML LL (SYRINGE) IMPLANT
TRAY DSU PREP LF (CUSTOM PROCEDURE TRAY) ×2 IMPLANT
UNDERPAD 30X30 INCONTINENT (UNDERPADS AND DIAPERS) ×2 IMPLANT

## 2012-12-23 NOTE — Progress Notes (Signed)
Assisted Dr. Ossey with right, ultrasound guided, interscalene  block. Side rails up, monitors on throughout procedure. See vital signs in flow sheet. Tolerated Procedure well. 

## 2012-12-23 NOTE — Anesthesia Preprocedure Evaluation (Signed)
Anesthesia Evaluation  Patient identified by MRN, date of birth, ID band Patient awake    Reviewed: Allergy & Precautions, H&P , NPO status , Patient's Chart, lab work & pertinent test results  Airway Mallampati: I TM Distance: >3 FB Neck ROM: Full    Dental   Pulmonary          Cardiovascular     Neuro/Psych  Headaches,    GI/Hepatic GERD-  Medicated and Controlled,  Endo/Other    Renal/GU      Musculoskeletal   Abdominal   Peds  Hematology   Anesthesia Other Findings   Reproductive/Obstetrics                           Anesthesia Physical Anesthesia Plan  ASA: II  Anesthesia Plan: General   Post-op Pain Management:    Induction: Intravenous  Airway Management Planned: LMA  Additional Equipment:   Intra-op Plan:   Post-operative Plan: Extubation in OR  Informed Consent: I have reviewed the patients History and Physical, chart, labs and discussed the procedure including the risks, benefits and alternatives for the proposed anesthesia with the patient or authorized representative who has indicated his/her understanding and acceptance.     Plan Discussed with: CRNA and Surgeon  Anesthesia Plan Comments:         Anesthesia Quick Evaluation

## 2012-12-23 NOTE — Op Note (Signed)
[id number] 

## 2012-12-23 NOTE — Anesthesia Postprocedure Evaluation (Signed)
Anesthesia Post Note  Patient: Rebecca Turner  Procedure(s) Performed: Procedure(s) (LRB): RIGHT HAND TRAPEZIUM EXCISION WITH RIGHT THUMB CARPOMETACARPEL (CMC) SUSPENSION PLASTY (Right)  Anesthesia type: general  Patient location: PACU  Post pain: Pain level controlled  Post assessment: Patient's Cardiovascular Status Stable  Last Vitals:  Filed Vitals:   12/23/12 1329  BP: 133/69  Pulse: 71  Temp: 36.8 C  Resp: 16    Post vital signs: Reviewed and stable  Level of consciousness: sedated  Complications: No apparent anesthesia complications

## 2012-12-23 NOTE — Transfer of Care (Signed)
Immediate Anesthesia Transfer of Care Note  Patient: Rebecca Turner  Procedure(s) Performed: Procedure(s): RIGHT HAND TRAPEZIUM EXCISION WITH RIGHT THUMB CARPOMETACARPEL (CMC) SUSPENSION PLASTY (Right)  Patient Location: PACU  Anesthesia Type:GA combined with regional for post-op pain  Level of Consciousness: sedated  Airway & Oxygen Therapy: Patient Spontanous Breathing and Patient connected to face mask oxygen  Post-op Assessment: Report given to PACU RN and Post -op Vital signs reviewed and stable  Post vital signs: Reviewed and stable  Complications: No apparent anesthesia complications

## 2012-12-23 NOTE — Brief Op Note (Signed)
12/23/2012  12:01 PM  PATIENT:  Rebecca Turner  62 y.o. female  PRE-OPERATIVE DIAGNOSIS:  RIGHT THUMB CARPOMETACARPAL END STAGE DEGENERATIVE ARTHRITIS  POST-OPERATIVE DIAGNOSIS:  RIGHT THUMB CARPOMETACARPAL END STAGE DEGENERATIVE ARTHRITIS  PROCEDURE:  Procedure(s): RIGHT HAND TRAPEZIUM EXCISION WITH RIGHT THUMB CARPOMETACARPEL (CMC) SUSPENSION PLASTY (Right)  SURGEON:  Surgeon(s) and Role:    * Wyn Forster., MD - Primary  PHYSICIAN ASSISTANT:   ASSISTANTS: Mallory Shirk.A-C    ANESTHESIA:   general  EBL:  Total I/O In: 1000 [I.V.:1000] Out: -   BLOOD ADMINISTERED:none  DRAINS: none   LOCAL MEDICATIONS USED: ropivacaine plexus block  SPECIMEN:  No Specimen  DISPOSITION OF SPECIMEN:  N/A  COUNTS:  YES  TOURNIQUET:  * Missing tourniquet times found for documented tourniquets in log:  [id number] *  DICTATION: .Other Dictation: Dictation Number [id number]  PLAN OF CARE: Discharge to home after PACU  PATIENT DISPOSITION:  PACU - hemodynamically stable.   Delay start of Pharmacological VTE agent (>24hrs) due to surgical blood loss or risk of bleeding: not applicable

## 2012-12-23 NOTE — Anesthesia Procedure Notes (Addendum)
Anesthesia Regional Block:  Supraclavicular block  Pre-Anesthetic Checklist: ,, timeout performed, Correct Patient, Correct Site, Correct Laterality, Correct Procedure, Correct Position, site marked, Risks and benefits discussed,  Surgical consent,  Pre-op evaluation,  At surgeon's request and post-op pain management  Laterality: Right  Prep: chloraprep       Needles:  Injection technique: Single-shot  Needle Type: Echogenic Stimulator Needle     Needle Length: 9cm  Needle Gauge: 21 and 21 G    Additional Needles:  Procedures: ultrasound guided (picture in chart) and nerve stimulator Supraclavicular block  Nerve Stimulator or Paresthesia:  Response: 0.4 mA,   Additional Responses:   Narrative:  Start time: 12/23/2012 10:28 AM End time: 12/23/2012 10:40 AM Injection made incrementally with aspirations every 5 mL.  Performed by: Personally  Anesthesiologist: Arta Bruce MD  Additional Notes: Monitors applied. Patient sedated. Sterile prep and drape,hand hygiene and sterile gloves were used. Relevant anatomy identified.Needle position confirmed.Local anesthetic injected incrementally after negative aspiration. Local anesthetic spread visualized around nerve(s). Vascular puncture avoided. No complications. Image printed for medical record.The patient tolerated the procedure well.        Procedure Name: LMA Insertion Date/Time: 12/23/2012 10:41 AM Performed by: Burna Cash Pre-anesthesia Checklist: Patient identified, Emergency Drugs available, Suction available and Patient being monitored Patient Re-evaluated:Patient Re-evaluated prior to inductionOxygen Delivery Method: Circle System Utilized Preoxygenation: Pre-oxygenation with 100% oxygen Intubation Type: IV induction Ventilation: Mask ventilation without difficulty LMA: LMA inserted LMA Size: 4.0 Number of attempts: 1 Airway Equipment and Method: bite block Placement Confirmation: positive ETCO2 Tube  secured with: Tape Dental Injury: Teeth and Oropharynx as per pre-operative assessment

## 2012-12-24 NOTE — Op Note (Signed)
NAMECHRISHANA, SPARGUR NO.:  [id number]  MEDICAL RECORD NO.:  [id number]  LOCATION:                                 FACILITY:  PHYSICIAN:  Katy Fitch. Stevana Dufner, M.D. DATE OF BIRTH:  17-Nov-1950  DATE OF PROCEDURE:  12/23/2012 DATE OF DISCHARGE:                              OPERATIVE REPORT   PREOPERATIVE DIAGNOSIS:  End-stage carpometacarpal arthritis, right dominant thumb.  POSTOPERATIVE DIAGNOSIS:  End-stage carpometacarpal arthritis, right dominant thumb.  OPERATION: 1. Right trapeziectomy with synovectomy of CMC joint removing multiple     loose bodies. 2. Transfer of one-half of distally based flexor carpi radialis around     abducted pollicis longus creating a Garcia-Elias style tendon     interposition arthroplasty. 3. Transfer of abductor pollicis longus palmar slip to thenar     musculature to augment thumb palmar abduction.  OPERATING SURGEON:  Katy Fitch. Renate Danh, MD.  ASSISTANT:  Marveen Reeks Dasnoit, PA-C  ANESTHESIA:  General by LMA.  SUPERVISING ANESTHESIOLOGIST:  Dr. Michelle Piper.  Note, Dr. Michelle Piper placed a preoperative ropivacaine plexus block for perioperative comfort in the holding area.  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, who has a history of bilateral thumb arthrosis.  In September, 2014, she underwent left thumb CMC reconstruction.  She was very pleased with her surgical experience.  She returns at this time requesting similar surgery on the right side.  After detailed informed consent, she was brought to the operating room at this time.  Preoperatively, she was reminded of potential risks and benefits of surgery which include infection, failure to relieve all her pain, possible neurovascular injury.  With this specific surgery, there are recognized risks of transient neurapraxia from retraction of the radial superficial sensory branches and CRPS type 1 or 2.  Preoperatively, she was  interviewed by Dr. Michelle Piper of Anesthesia.  He recommended a perioperative block for comfort.  In the holding area with ultrasound guidance, Dr. Michelle Piper placed a ropivacaine plexus block without complication.  This led excellent anesthesia of the right hand and arm.  Ms. Reeder was then transferred to room 6 of [facility] where under Dr. Deirdre Priest direct supervision, general anesthesia by LMA technique was induced.  The right arm was prepped with Betadine soap and solution, sterilely draped.  A pneumatic tourniquet was applied to the proximal right brachium.  2 g of Ancef were administered as an IV prophylactic antibiotic.  Following exsanguination of the right arm with Esmarch bandage, arterial tourniquet of proximal brachium was inflated to 220 mmHg.  A routine surgical time-out was accomplished followed by creation of a curvilinear Wagner type incision exposing the thenar musculature and the abductor pollicis longus tendon slips.  The capsule of the [facility] joint was identified, noted to be very thickened with multiple loose bodies and inflammation.  The interval between the palmar slip of the abductor pollicis longus which inserted on the thenar musculature and the dorsal slip which inserted at the base of the thumb metacarpal was carefully developed followed by subperiosteal exposure of the trapezium taking care to identify the flexor carpi radialis tendon and the radial artery dorsally.  The trapezium was removed piecemeal  with osteotome and rongeur.  There were multiple loose bodies trapped between the thumb metacarpal and the index metacarpal.  Following complete synovectomy, we then harvested the ulnar half of the flexor carpi radialis, releasing at the musculotendinous junction passing it distally with a Carroll Tendon passing forceps into the cavity created by trapezium resection.  We split it to its insertion at the base of the index metacarpal and subsequently  performed a 3 Windsor knot tendon interposition arthroplasty in the manner of Garcia-Elias. Excellent interposition was achieved.  We then repaired the capsule over the interposed material with mattress sutures of 3-0 Ethibond.  The palmar slip of the abductor pollicis longus was then advanced about 1 cm and repaired to the fascia of the abductor pollicis brevis.  This led to excellent palmar abduction of the thumb.  The wound was then irrigated thoroughly with sterile saline followed by repair of the skin with intradermal 3-0 Prolene and Steri-Strips.  Ms. Losasso was placed in compressive dressing with thumb in the palmar abducted position.  There were no apparent complications.  We will see her back for followup in our office in 8 days for dressing removal and placement in a thermoplastic splint.  Questions were invited and answered detail with the family postoperatively.  She is provided prescription for Dilaudid 2 mg 1 or 2 tablets p.o. q.4-6 hours p.r.n. pain, 30 tablets without refill, also Keflex 500 mg 1 p.o. q.8 hours x4 days as a prophylactic antibiotic.     Katy Fitch Lanaysia Fritchman, M.D.     RVS/MEDQ  D:  12/23/2012  T:  12/23/2012  Job:  [id number]

## 2012-12-29 ENCOUNTER — Encounter (HOSPITAL_BASED_OUTPATIENT_CLINIC_OR_DEPARTMENT_OTHER): Payer: Self-pay | Admitting: Orthopedic Surgery

## 2013-03-02 ENCOUNTER — Other Ambulatory Visit: Payer: Self-pay | Admitting: *Deleted

## 2013-03-02 MED ORDER — ESTRADIOL 2 MG VA RING: 2.0000 mg | VAGINAL_RING | VAGINAL | Status: DC

## 2013-03-02 NOTE — Telephone Encounter (Signed)
Last AEX and refill 12/27/2011 x 1 year Next appt 03/27/2013  Will refill for 1 month until appt.

## 2013-03-25 ENCOUNTER — Ambulatory Visit: Payer: Self-pay | Admitting: Obstetrics & Gynecology

## 2013-03-26 ENCOUNTER — Encounter: Payer: Self-pay | Admitting: Obstetrics & Gynecology

## 2013-03-27 ENCOUNTER — Ambulatory Visit (INDEPENDENT_AMBULATORY_CARE_PROVIDER_SITE_OTHER): Payer: 59 | Admitting: Obstetrics & Gynecology

## 2013-03-27 ENCOUNTER — Encounter: Payer: Self-pay | Admitting: Obstetrics & Gynecology

## 2013-03-27 VITALS — BP 120/68 | HR 60 | Resp 16 | Ht 62.75 in | Wt 114.8 lb

## 2013-03-27 DIAGNOSIS — Z01419 Encounter for gynecological examination (general) (routine) without abnormal findings: Secondary | ICD-10-CM

## 2013-03-27 DIAGNOSIS — Z124 Encounter for screening for malignant neoplasm of cervix: Secondary | ICD-10-CM

## 2013-03-27 MED ORDER — ESTRADIOL 2 MG VA RING: 2.0000 mg | VAGINAL_RING | VAGINAL | Status: DC

## 2013-03-27 NOTE — Progress Notes (Signed)
63 y.o. G2P1 SingleCaucasianF here for annual exam.  No vaginal bleeding.  Hasn't done repeat colonoscopy yet.  Last one was 2001.  She states she will do that this year.    Patient's last menstrual period was 01/02/1999.          Sexually active: yes  The current method of family planning is none.    Exercising: yes  walking Smoker:  no  Health Maintenance: Pap:  11/27/10 WNL/negative HR HPV History of abnormal Pap:  yes MMG:  02/11/12 3D-normal Colonoscopy:  Dr Dagmar Hait says this is due BMD:   2010, -1.1/-1.4 TDaP:  2007 Screening Labs: PCP, Hb today: PCP, Urine today: PCP   reports that she has never smoked. She has never used smokeless tobacco. She reports that she drinks alcohol. She reports that she does not use illicit drugs.  Past Medical History  Diagnosis Date  . Migraines   . GERD (gastroesophageal reflux disease)   . [facility] DJD(carpometacarpal degenerative joint disease), localized primary 09/2012    left thumb  . Dental crowns present   . Cataract, immature   . Wears glasses   . Hyperlipidemia   . Hyperkeratosis of cervix   . Anxiety   . Labial cyst     1 1/2 cm cyst on right sup labia maj  . Osteopenia   . Abnormal Pap smear of cervix     Past Surgical History  Procedure Laterality Date  . Tonsillectomy      as a child  . Nasal fracture surgery    . Otoplasty      as a child  . Dorsal compartment release Left 09/11/2012    Procedure: TRAPEZIUM EXCISION FOR TRANSFER FIRST DORSAL RELEASE;  Surgeon: Cammie Sickle., MD;  Location: [place];  Service: Orthopedics;  Laterality: Left;  . Carpometacarpel suspension plasty Right 12/23/2012    Procedure: RIGHT HAND TRAPEZIUM EXCISION WITH RIGHT THUMB CARPOMETACARPEL ([place]) SUSPENSION PLASTY;  Surgeon: Cammie Sickle., MD;  Location: [place];  Service: Orthopedics;  Laterality: Right;    Current Outpatient Prescriptions  Medication Sig Dispense Refill  . aspirin 81 MG tablet Take  81 mg by mouth daily.      Marland Kitchen buPROPion (WELLBUTRIN) 75 MG tablet Take 75 mg by mouth 2 (two) times daily.      . citalopram (CELEXA) 10 MG tablet Take 10 mg by mouth daily.      Marland Kitchen estradiol (ESTRING) 2 MG vaginal ring Place 2 mg vaginally every 3 (three) months. follow package directions  1 each  0  . fish oil-omega-3 fatty acids 1000 MG capsule Take 2 g by mouth daily.      . magnesium 30 MG tablet Take 30 mg by mouth 2 (two) times daily.      Marland Kitchen omeprazole (PRILOSEC) 40 MG capsule Take 40 mg by mouth 2 (two) times daily.      . rosuvastatin (CRESTOR) 10 MG tablet Take 10 mg by mouth daily.      . traZODone (DESYREL) 50 MG tablet Take 50 mg by mouth at bedtime.       No current facility-administered medications for this visit.    Family History  Problem Relation Age of Onset  . Osteoporosis Mother   . Stroke Mother   . Hypertension Mother   . Stroke Maternal Grandmother   . Hypertension Maternal Grandmother     ROS:  Pertinent items are noted in HPI.  Otherwise, a comprehensive ROS was  negative.  Exam:   BP 120/68  Pulse 60  Resp 16  Ht 5' 2.75" (1.594 m)  Wt 114 lb 12.8 oz (52.073 kg)  BMI 20.49 kg/m2  LMP 01/02/1999  Weight change: -4lb  Height: 5' 2.75" (159.4 cm)  Ht Readings from Last 3 Encounters:  03/27/13 5' 2.75" (1.594 m)  12/23/12 5\' 3"  (1.6 m)  12/23/12 5\' 3"  (1.6 m)    General appearance: alert, cooperative and appears stated age Head: Normocephalic, without obvious abnormality, atraumatic Neck: no adenopathy, supple, symmetrical, trachea midline and thyroid normal to inspection and palpation Lungs: clear to auscultation bilaterally Breasts: normal appearance, no masses or tenderness Heart: regular rate and rhythm Abdomen: soft, non-tender; bowel sounds normal; no masses,  no organomegaly Extremities: extremities normal, atraumatic, no cyanosis or edema Skin: Skin color, texture, turgor normal. No rashes or lesions Lymph nodes: Cervical, supraclavicular,  and axillary nodes normal. No abnormal inguinal nodes palpated Neurologic: Grossly normal   Pelvic: External genitalia:  no lesions              Urethra:  normal appearing urethra with no masses, tenderness or lesions              Bartholins and Skenes: normal                 Vagina: normal appearing vagina with normal color and discharge, no lesions              Cervix: no lesions              Pap taken: yes Bimanual Exam:  Uterus:  normal size, contour, position, consistency, mobility, non-tender              Adnexa: normal adnexa and no mass, fullness, tenderness               Rectovaginal: Confirms               Anus:  normal sphincter tone, no lesions  A:  Well Woman with normal exam PMP, no HRT Vaginal atrophic changes Elevated lipids LS&A, doing well Migraines  P:   Mammogram yearly pap smear obtained today Labs with Dr. Dagmar Hait. Estring 2mg  every three months.  Rx to pharmacy. return annually or prn  An After Visit Summary was printed and given to the patient.

## 2013-03-31 LAB — IPS PAP SMEAR ONLY

## 2013-04-10 ENCOUNTER — Ambulatory Visit
Admission: RE | Admit: 2013-04-10 | Discharge: 2013-04-10 | Disposition: A | Discharge status: Home / Self Care | Payer: 59 | Source: Ambulatory Visit

## 2013-04-10 ENCOUNTER — Telehealth: Payer: Self-pay | Admitting: Obstetrics & Gynecology

## 2013-04-10 ENCOUNTER — Other Ambulatory Visit: Payer: Self-pay

## 2013-04-10 DIAGNOSIS — Z1231 Encounter for screening mammogram for malignant neoplasm of breast: Secondary | ICD-10-CM

## 2013-04-10 DIAGNOSIS — Z780 Asymptomatic menopausal state: Secondary | ICD-10-CM

## 2013-04-10 NOTE — Telephone Encounter (Signed)
Patient is at the [facility] right now and needs an order sent for her MMG.

## 2013-04-10 NOTE — Telephone Encounter (Signed)
Dexa order sent.  Last Dexa 2010. [facility].   Routing to provider for final review. Patient agreeable to disposition. Will close encounter

## 2013-04-13 ENCOUNTER — Telehealth: Payer: Self-pay

## 2013-04-13 NOTE — Telephone Encounter (Signed)
Message copied by Robley Fries on Mon Apr 13, 2013  8:46 AM ------      Message from: Megan Salon      Created: Sat Apr 11, 2013  7:03 AM       Please inform BMD showed osteopenia in both hip and spine.  Spine has been stable.  Hip has decreased about 4% since 2010.  Still, she just has some mild osteopenia.  Repeat 3-5 years.  Get 1200-1500mg  calcium (including both dietary and supplements) and make sure taking at least 800IU vit D. ------

## 2013-04-13 NOTE — Telephone Encounter (Signed)
Lmtcb//kn 

## 2013-04-15 NOTE — Telephone Encounter (Signed)
Spoke with patient and message given. Verbalized understanding.  Routing to provider for final review. Patient agreeable to disposition. Will close encounter

## 2013-04-17 ENCOUNTER — Telehealth: Payer: Self-pay

## 2013-04-17 NOTE — Telephone Encounter (Signed)
Lmtcb//kn 

## 2013-04-22 NOTE — Telephone Encounter (Signed)
Patient is calling Rebecca Turner back please call after 2

## 2013-04-30 NOTE — Telephone Encounter (Signed)
Patient notified of BMD results.//kn 

## 2013-04-30 NOTE — Telephone Encounter (Signed)
BMD results given//kn

## 2013-05-13 ENCOUNTER — Ambulatory Visit
Admission: RE | Admit: 2013-05-13 | Discharge: 2013-05-13 | Disposition: A | Discharge status: Home / Self Care | Payer: 59 | Source: Ambulatory Visit

## 2013-05-13 ENCOUNTER — Encounter (INDEPENDENT_AMBULATORY_CARE_PROVIDER_SITE_OTHER): Payer: Self-pay

## 2013-05-13 ENCOUNTER — Ambulatory Visit: Payer: 59

## 2013-05-13 DIAGNOSIS — Z1231 Encounter for screening mammogram for malignant neoplasm of breast: Secondary | ICD-10-CM

## 2013-11-02 ENCOUNTER — Encounter: Payer: Self-pay | Admitting: Obstetrics & Gynecology

## 2014-01-28 ENCOUNTER — Other Ambulatory Visit: Payer: Self-pay | Admitting: *Deleted

## 2014-01-28 MED ORDER — CLOBETASOL PROPIONATE 0.05 % EX OINT: 1.0000 "application " | TOPICAL_OINTMENT | Freq: Two times a day (BID) | CUTANEOUS | Status: DC

## 2014-01-28 NOTE — Telephone Encounter (Signed)
Medication refill request: Clobetasol 0.05 % Last AEX:  03/27/13 with Dr.Miller Next AEX: 04/08/14 with Dr. Sabra Heck Last refilled: 01/30/11#60 gm/1 refill Refill authorized: Please advise.  (Chart in your incoming basket)

## 2014-04-08 ENCOUNTER — Ambulatory Visit (INDEPENDENT_AMBULATORY_CARE_PROVIDER_SITE_OTHER): Payer: 59 | Admitting: Obstetrics & Gynecology

## 2014-04-08 ENCOUNTER — Encounter: Payer: Self-pay | Admitting: Obstetrics & Gynecology

## 2014-04-08 VITALS — BP 124/82 | HR 60 | Resp 16 | Ht 62.34 in | Wt 114.0 lb

## 2014-04-08 DIAGNOSIS — Z1211 Encounter for screening for malignant neoplasm of colon: Secondary | ICD-10-CM

## 2014-04-08 DIAGNOSIS — M8580 Other specified disorders of bone density and structure, unspecified site: Secondary | ICD-10-CM

## 2014-04-08 DIAGNOSIS — Z01419 Encounter for gynecological examination (general) (routine) without abnormal findings: Secondary | ICD-10-CM | POA: Diagnosis not present

## 2014-04-08 MED ORDER — ESTROGENS, CONJUGATED 0.625 MG/GM VA CREA: TOPICAL_CREAM | VAGINAL | Status: DC

## 2014-04-08 NOTE — Progress Notes (Signed)
64 y.o. G2P1 SingleCaucasianF here for annual exam.  Doing well.  Did have a physical with Dr. Dagmar Hait last year.  Ended up not using the Estring for very long.  It seems to cause itching a lot for her.  Now having increased pain with intercourse.  Wants to discuss other options.    Patient's last menstrual period was 01/02/1999.          Sexually active: Yes.    The current method of family planning is post menopausal status.    Exercising: Yes.    walking Smoker:  no  Health Maintenance: Pap:  03/27/13 WNL History of abnormal Pap:  Yes years ago MMG:  05/13/13-normal Colonoscopy:  Over 10 years-aware is due BMD:   2010 TDaP:  2007 Screening Labs: PCP, Hb today: PCP, Urine today: PCP   reports that she has never smoked. She has never used smokeless tobacco. She reports that she drinks alcohol. She reports that she does not use illicit drugs.  Past Medical History  Diagnosis Date  . Migraines   . GERD (gastroesophageal reflux disease)   . [facility] DJD(carpometacarpal degenerative joint disease), localized primary 09/2012    left thumb  . Dental crowns present   . Cataract, immature   . Wears glasses   . Hyperlipidemia   . Hyperkeratosis of cervix   . Anxiety   . Labial cyst     1 1/2 cm cyst on right sup labia maj  . Osteopenia   . Abnormal Pap smear of cervix     Past Surgical History  Procedure Laterality Date  . Tonsillectomy      as a child  . Nasal fracture surgery    . Otoplasty      as a child  . Dorsal compartment release Left 09/11/2012    Procedure: TRAPEZIUM EXCISION FOR TRANSFER FIRST DORSAL RELEASE;  Surgeon: Cammie Sickle., MD;  Location: [place];  Service: Orthopedics;  Laterality: Left;  . Carpometacarpel suspension plasty Right 12/23/2012    Procedure: RIGHT HAND TRAPEZIUM EXCISION WITH RIGHT THUMB CARPOMETACARPEL ([place]) SUSPENSION PLASTY;  Surgeon: Cammie Sickle., MD;  Location: [place];  Service: Orthopedics;   Laterality: Right;    Current Outpatient Prescriptions  Medication Sig Dispense Refill  . aspirin 81 MG tablet Take 81 mg by mouth daily.    Marland Kitchen buPROPion (WELLBUTRIN) 75 MG tablet Take 75 mg by mouth 2 (two) times daily.    . citalopram (CELEXA) 10 MG tablet Take 10 mg by mouth daily.    . clobetasol ointment (TEMOVATE) 9.67 % Apply 1 application topically 2 (two) times daily. 30 g 0  . fish oil-omega-3 fatty acids 1000 MG capsule Take 2 g by mouth daily.    . magnesium 30 MG tablet Take 30 mg by mouth 2 (two) times daily.    Marland Kitchen omeprazole (PRILOSEC) 40 MG capsule Take 40 mg by mouth 2 (two) times daily.    . rosuvastatin (CRESTOR) 10 MG tablet Take 10 mg by mouth daily.    . traZODone (DESYREL) 50 MG tablet Take 50 mg by mouth at bedtime.     No current facility-administered medications for this visit.    Family History  Problem Relation Age of Onset  . Osteoporosis Mother   . Stroke Mother   . Hypertension Mother   . Stroke Maternal Grandmother   . Hypertension Maternal Grandmother     ROS:  Pertinent items are noted in HPI.  Otherwise, a  comprehensive ROS was negative.  Exam:   BP 124/82 mmHg  Pulse 60  Resp 16  Ht 5' 2.34" (1.583 m)  Wt 114 lb (51.71 kg)  BMI 20.64 kg/m2  LMP 01/02/1999  Weight change: stable   Height: 5' 2.34" (158.3 cm)  Ht Readings from Last 3 Encounters:  04/08/14 5' 2.34" (1.583 m)  03/27/13 5' 2.75" (1.594 m)  12/23/12 5\' 3"  (1.6 m)    General appearance: alert, cooperative and appears stated age Head: Normocephalic, without obvious abnormality, atraumatic Neck: no adenopathy, supple, symmetrical, trachea midline and thyroid normal to inspection and palpation Lungs: clear to auscultation bilaterally Breasts: normal appearance, no masses or tenderness Heart: regular rate and rhythm Abdomen: soft, non-tender; bowel sounds normal; no masses,  no organomegaly Extremities: extremities normal, atraumatic, no cyanosis or edema Skin: Skin color,  texture, turgor normal. No rashes or lesions Lymph nodes: Cervical, supraclavicular, and axillary nodes normal. No abnormal inguinal nodes palpated Neurologic: Grossly normal   Pelvic: External genitalia:  no lesions              Urethra:  normal appearing urethra with no masses, tenderness or lesions              Bartholins and Skenes: normal                 Vagina: normal appearing vagina with normal color and discharge, no lesions              Cervix: no lesions              Pap taken: No. Bimanual Exam:  Uterus:  normal size, contour, position, consistency, mobility, non-tender              Adnexa: normal adnexa and no mass, fullness, tenderness               Rectovaginal: Confirms               Anus:  normal sphincter tone, no lesions  Chaperone was present for exam.  A:  Well Woman with normal exam PMP, no HRT Vaginal atrophic changes Elevated lipids LS&A, doing well Migraines  P: Mammogram yearly pap smear obtained today Labs with Dr. Dagmar Hait. Premarin vaginal cream No temovate rx needed today but will call if needs one IFOB given today.  Declines colonoscopy.  Aware is due. return annually or prn

## 2014-04-25 ENCOUNTER — Encounter: Payer: Self-pay | Admitting: Obstetrics & Gynecology

## 2014-04-25 NOTE — Progress Notes (Signed)
Fecal occult testing not returned.  Letter written to be mailed 04/26/14.

## 2014-05-25 NOTE — Progress Notes (Signed)
Fecal occult testing not returned.  Letter was written to remind pt.

## 2014-06-03 ENCOUNTER — Other Ambulatory Visit: Payer: Self-pay

## 2014-06-03 DIAGNOSIS — Z1231 Encounter for screening mammogram for malignant neoplasm of breast: Secondary | ICD-10-CM

## 2014-06-14 ENCOUNTER — Ambulatory Visit
Admission: RE | Admit: 2014-06-14 | Discharge: 2014-06-14 | Disposition: A | Discharge status: Home / Self Care | Payer: 59 | Source: Ambulatory Visit

## 2014-06-14 DIAGNOSIS — Z1231 Encounter for screening mammogram for malignant neoplasm of breast: Secondary | ICD-10-CM

## 2015-01-10 MED FILL — BUTALBITAL/ACETAMINOPHEN 50: 50-325 | 30 days supply | Qty: 60 | Fill #1

## 2015-01-25 MED FILL — BUPROPION HCL XL 150 MG TAB: 150 | 90 days supply | Qty: 90 | Fill #2

## 2015-01-25 MED FILL — TEMAZEPAM 30 MG CAPSULE: 30 | 90 days supply | Qty: 90 | Fill #0

## 2015-02-01 MED FILL — AZITHROMYCIN 250 MG TABLET: 250 | 5 days supply | Qty: 6 | Fill #0

## 2015-02-21 MED FILL — OMEPRAZOLE DR 40 MG CAPSULE: 40 | 90 days supply | Qty: 180 | Fill #0

## 2015-02-21 MED FILL — ALPRAZolam 0.5 MG TABS: 0.5 | 90 days supply | Qty: 90 | Fill #1

## 2015-02-25 DIAGNOSIS — G43909 Migraine, unspecified, not intractable, without status migrainosus: Secondary | ICD-10-CM | POA: Diagnosis not present

## 2015-02-25 DIAGNOSIS — K219 Gastro-esophageal reflux disease without esophagitis: Secondary | ICD-10-CM | POA: Diagnosis not present

## 2015-02-25 DIAGNOSIS — Z6821 Body mass index (BMI) 21.0-21.9, adult: Secondary | ICD-10-CM | POA: Diagnosis not present

## 2015-02-25 DIAGNOSIS — F4325 Adjustment disorder with mixed disturbance of emotions and conduct: Secondary | ICD-10-CM | POA: Diagnosis not present

## 2015-02-25 DIAGNOSIS — R079 Chest pain, unspecified: Secondary | ICD-10-CM | POA: Diagnosis not present

## 2015-02-25 DIAGNOSIS — R0602 Shortness of breath: Secondary | ICD-10-CM | POA: Diagnosis not present

## 2015-02-25 MED FILL — FLUTICASONE PROP 50 MCG SPR: 50 | 30 days supply | Qty: 16 | Fill #0

## 2015-03-14 MED FILL — CITALOPRAM HBR 40 MG TABLET: 40 | 90 days supply | Qty: 90 | Fill #2

## 2015-03-16 DIAGNOSIS — H5213 Myopia, bilateral: Secondary | ICD-10-CM | POA: Diagnosis not present

## 2015-04-13 ENCOUNTER — Ambulatory Visit: Payer: 59 | Admitting: Interventional Cardiology

## 2015-04-15 MED FILL — traZODone HCL 50 MG TABS: 50 | 90 days supply | Qty: 180 | Fill #2

## 2015-05-04 ENCOUNTER — Telehealth: Payer: Self-pay | Admitting: Obstetrics & Gynecology

## 2015-05-04 NOTE — Telephone Encounter (Signed)
Left message regarding upcoming aex appointment with Dr.Miller has been canceled and needs to be rescheduled.

## 2015-05-05 MED FILL — TRIAMCINOLONE 0.1% CREAM: 0.1 | 10 days supply | Qty: 15 | Fill #0

## 2015-05-06 MED FILL — SULFAMETHOXAZOLE/TMP DS TAB: 800-160 | 45 days supply | Qty: 90 | Fill #0

## 2015-05-13 NOTE — Telephone Encounter (Signed)
2nd call to patient. Left message regarding upcoming appointment has been canceled and needs to be rescheduled.

## 2015-05-23 MED FILL — OMEPRAZOLE DR 40 MG CAPSULE: 40 | 90 days supply | Qty: 180 | Fill #1

## 2015-06-02 MED FILL — ALPRAZolam 0.5 MG TABS: 0.5 | 90 days supply | Qty: 90 | Fill #0

## 2015-06-10 MED FILL — ROSUVASTATIN CALCIUM 10 MG: 10 | 90 days supply | Qty: 90 | Fill #2

## 2015-06-10 MED FILL — CITALOPRAM HBR 40 MG TABLET: 40 | 90 days supply | Qty: 90 | Fill #3

## 2015-06-17 ENCOUNTER — Ambulatory Visit: Payer: 59 | Admitting: Obstetrics & Gynecology

## 2015-06-23 ENCOUNTER — Ambulatory Visit (INDEPENDENT_AMBULATORY_CARE_PROVIDER_SITE_OTHER): Payer: 59 | Admitting: Obstetrics & Gynecology

## 2015-06-23 ENCOUNTER — Encounter: Payer: Self-pay | Admitting: Obstetrics & Gynecology

## 2015-06-23 VITALS — BP 124/64 | HR 78 | Resp 16 | Ht 62.5 in | Wt 118.6 lb

## 2015-06-23 DIAGNOSIS — Z205 Contact with and (suspected) exposure to viral hepatitis: Secondary | ICD-10-CM

## 2015-06-23 DIAGNOSIS — Z01419 Encounter for gynecological examination (general) (routine) without abnormal findings: Secondary | ICD-10-CM | POA: Diagnosis not present

## 2015-06-23 DIAGNOSIS — Z124 Encounter for screening for malignant neoplasm of cervix: Secondary | ICD-10-CM

## 2015-06-23 DIAGNOSIS — D649 Anemia, unspecified: Secondary | ICD-10-CM

## 2015-06-23 DIAGNOSIS — Z1211 Encounter for screening for malignant neoplasm of colon: Secondary | ICD-10-CM | POA: Diagnosis not present

## 2015-06-23 LAB — CBC
HCT: 35 % (ref 35.0–45.0)
Hemoglobin: 11.5 g/dL — ABNORMAL LOW (ref 11.7–15.5)
MCH: 29.8 pg (ref 27.0–33.0)
MCHC: 32.9 g/dL (ref 32.0–36.0)
MCV: 90.7 fL (ref 80.0–100.0)
MPV: 10.7 fL (ref 7.5–12.5)
PLATELETS: 159 10*3/uL (ref 140–400)
RBC: 3.86 MIL/uL (ref 3.80–5.10)
RDW: 13.4 % (ref 11.0–15.0)
WBC: 4.2 10*3/uL (ref 3.8–10.8)

## 2015-06-23 LAB — HEMOCCULT GUIAC POC 1CARD (OFFICE)

## 2015-06-23 LAB — FERRITIN: Ferritin: 58 ng/mL (ref 20–288)

## 2015-06-23 LAB — TSH: TSH: 2.59 m[IU]/L

## 2015-06-23 MED ORDER — CLOBETASOL PROPIONATE 0.05 % EX OINT: 1.0000 "application " | TOPICAL_OINTMENT | Freq: Two times a day (BID) | CUTANEOUS | Status: DC

## 2015-06-23 MED FILL — CLOBETASOL 0.05% OINTMENT: 0.05 | 15 days supply | Qty: 30 | Fill #0

## 2015-06-23 NOTE — Progress Notes (Addendum)
65 y.o. G2P1 Single Caucasian F here for annual exam.  Doing well.  Denies vaginal bleeding.  Pt reports she is feeling much more fatigue.  Has mild anemia.  Is overdue for a colonoscopy.   PCP:  Has physical with Dr. Dagmar Hait next month  Patient's last menstrual period was 01/02/1999.          Sexually active: Yes.    The current method of family planning is post menopausal status.    Exercising: Yes.    walking Smoker:  no  Health Maintenance: Pap:  03/27/2013 negative, 11/12 neg HR HPV History of abnormal Pap:  yes MMG:  06/14/2014 BIRADS 1 negative Colonoscopy: >10 years BMD:   04/10/2013 osteopenia  TDaP:  Up to date  Pneumonia vaccine(s):  never Zostavax:   never Hep C testing: drawn today  Screening Labs: drawn today, Hb today: 11.1, Urine today: declined   reports that she has never smoked. She has never used smokeless tobacco. She reports that she drinks alcohol. She reports that she does not use illicit drugs.  Past Medical History  Diagnosis Date  . Migraines   . GERD (gastroesophageal reflux disease)   . [facility] DJD(carpometacarpal degenerative joint disease), localized primary 09/2012    left thumb  . Dental crowns present   . Cataract, immature   . Wears glasses   . Hyperlipidemia   . Hyperkeratosis of cervix   . Anxiety   . Labial cyst     1 1/2 cm cyst on right sup labia maj  . Osteopenia   . Abnormal Pap smear of cervix   . Chest pain   . DOE (dyspnea on exertion)   . Migraine headache     Past Surgical History  Procedure Laterality Date  . Tonsillectomy      as a child  . Nasal fracture surgery    . Otoplasty      as a child  . Dorsal compartment release Left 09/11/2012    Procedure: TRAPEZIUM EXCISION FOR TRANSFER FIRST DORSAL RELEASE;  Surgeon: Cammie Sickle., MD;  Location: [place];  Service: Orthopedics;  Laterality: Left;  . Carpometacarpel suspension plasty Right 12/23/2012    Procedure: RIGHT HAND TRAPEZIUM EXCISION WITH RIGHT  THUMB CARPOMETACARPEL ([place]) SUSPENSION PLASTY;  Surgeon: Cammie Sickle., MD;  Location: [place];  Service: Orthopedics;  Laterality: Right;    Current Outpatient Prescriptions  Medication Sig Dispense Refill  . aspirin 81 MG tablet Take 81 mg by mouth daily.    Marland Kitchen buPROPion (WELLBUTRIN) 75 MG tablet Take 75 mg by mouth 2 (two) times daily.    . citalopram (CELEXA) 10 MG tablet Take 10 mg by mouth daily.    . clobetasol ointment (TEMOVATE) AB-123456789 % Apply 1 application topically 2 (two) times daily. 30 g 0  . conjugated estrogens (PREMARIN) vaginal cream 1/2 gram vaginally twice weekly 30 g 6  . fish oil-omega-3 fatty acids 1000 MG capsule Take 2 g by mouth daily.    . magnesium 30 MG tablet Take 30 mg by mouth 2 (two) times daily.    Marland Kitchen omeprazole (PRILOSEC) 40 MG capsule Take 40 mg by mouth 2 (two) times daily.    . rosuvastatin (CRESTOR) 10 MG tablet Take 10 mg by mouth daily.    . traZODone (DESYREL) 50 MG tablet Take 50 mg by mouth at bedtime.     No current facility-administered medications for this visit.    Family History  Problem Relation  Age of Onset  . Osteoporosis Mother   . Stroke Mother   . Hypertension Mother   . Heart attack Mother   . Stroke Maternal Grandmother   . Hypertension Maternal Grandmother   . Leukemia Father     ROS:  Pertinent items are noted in HPI.  Otherwise, a comprehensive ROS was negative.  Exam:   Filed Vitals:   06/23/15 1057  BP: 124/64  Pulse: 78  Resp: 16  Height: 5' 2.5" (1.588 m)  Weight: 118 lb 9.6 oz (53.797 kg)    General appearance: alert, cooperative and appears stated age Head: Normocephalic, without obvious abnormality, atraumatic Neck: no adenopathy, supple, symmetrical, trachea midline and thyroid normal to inspection and palpation Lungs: clear to auscultation bilaterally Breasts: normal appearance, no masses or tenderness Heart: regular rate and rhythm Abdomen: soft, non-tender; bowel sounds normal;  no masses,  no organomegaly Extremities: extremities normal, atraumatic, no cyanosis or edema Skin: Skin color, texture, turgor normal. No rashes or lesions Lymph nodes: Cervical, supraclavicular, and axillary nodes normal. No abnormal inguinal nodes palpated Neurologic: Grossly normal   Pelvic: External genitalia:  no lesions              Urethra:  normal appearing urethra with no masses, tenderness or lesions              Bartholins and Skenes: normal                 Vagina: normal appearing vagina with normal color and discharge, no lesions              Cervix: no lesions              Pap taken: Yes.   Bimanual Exam:  Uterus:  normal size, contour, position, consistency, mobility, non-tender              Adnexa: normal adnexa and no mass, fullness, tenderness               Rectovaginal: Confirms               Anus:  normal sphincter tone, no lesions  Guaiac: negative   Chaperone was present for exam.  A:  Well Woman with normal exam PMP, no HRT Vaginal atrophic changes Elevated lipids LS&A, stable Migraines Mild anemia  P: Mammogram yearly pap smear obtained today CBC, ferritin, TSH Hep C antibody RF for Temovate 0.05% ointment up to bid for no more than 7 days.  Rx to pharmacy. Referral to colonoscopy.  Pt is DUE.  Guaiac was negative in office today. return annually or prn

## 2015-06-23 NOTE — Addendum Note (Signed)
Addended by: Megan Salon on: 06/23/2015 05:00 PM   Modules accepted: Orders, SmartSet

## 2015-06-24 LAB — HEPATITIS C ANTIBODY: HCV AB: NEGATIVE

## 2015-06-24 LAB — IPS PAP TEST WITH REFLEX TO HPV

## 2015-06-30 ENCOUNTER — Telehealth: Payer: Self-pay | Admitting: *Deleted

## 2015-06-30 NOTE — Telephone Encounter (Signed)
Patient called requesting her labs from 06/23/15 be sent to her PCP. I routed labs while on the phone with patient -eh

## 2015-07-01 ENCOUNTER — Telehealth: Payer: Self-pay | Admitting: Obstetrics & Gynecology

## 2015-07-01 DIAGNOSIS — E7800 Pure hypercholesterolemia, unspecified: Secondary | ICD-10-CM | POA: Diagnosis not present

## 2015-07-01 NOTE — Telephone Encounter (Signed)
Left voicemail regarding referral appointment. Attempting to scheduled with [facility]. Called to review contact information for their office.

## 2015-07-07 DIAGNOSIS — E7800 Pure hypercholesterolemia, unspecified: Secondary | ICD-10-CM | POA: Diagnosis not present

## 2015-07-07 DIAGNOSIS — D649 Anemia, unspecified: Secondary | ICD-10-CM | POA: Diagnosis not present

## 2015-07-07 DIAGNOSIS — J45990 Exercise induced bronchospasm: Secondary | ICD-10-CM | POA: Diagnosis not present

## 2015-07-07 DIAGNOSIS — K219 Gastro-esophageal reflux disease without esophagitis: Secondary | ICD-10-CM | POA: Diagnosis not present

## 2015-07-07 DIAGNOSIS — M1990 Unspecified osteoarthritis, unspecified site: Secondary | ICD-10-CM | POA: Diagnosis not present

## 2015-07-07 DIAGNOSIS — I7300 Raynaud's syndrome without gangrene: Secondary | ICD-10-CM | POA: Diagnosis not present

## 2015-07-07 DIAGNOSIS — G43909 Migraine, unspecified, not intractable, without status migrainosus: Secondary | ICD-10-CM | POA: Diagnosis not present

## 2015-07-07 DIAGNOSIS — Z0000 Encounter for general adult medical examination without abnormal findings: Secondary | ICD-10-CM | POA: Diagnosis not present

## 2015-07-07 DIAGNOSIS — Z1389 Encounter for screening for other disorder: Secondary | ICD-10-CM | POA: Diagnosis not present

## 2015-07-07 MED FILL — PHENADOZ 25 MG SUPP: 25 | 3 days supply | Qty: 20 | Fill #0

## 2015-07-13 MED FILL — TEMAZEPAM 30 MG CAPSULE: 30 | 90 days supply | Qty: 90 | Fill #1

## 2015-07-15 NOTE — Telephone Encounter (Signed)
We and Dr Marla Roe office have attempted contact with detailed voicemails several times with no response. This is for a screening colonoscopy. Per Dr Sabra Heck, ok to close referral.

## 2015-07-22 MED FILL — traZODone HCL 50 MG TABS: 50 | 90 days supply | Qty: 180 | Fill #3

## 2015-08-16 ENCOUNTER — Encounter (HOSPITAL_COMMUNITY): Payer: Self-pay | Admitting: Emergency Medicine

## 2015-08-16 ENCOUNTER — Ambulatory Visit (HOSPITAL_COMMUNITY)
Admission: EM | Admit: 2015-08-16 | Discharge: 2015-08-16 | Disposition: A | Discharge status: Home / Self Care | Payer: 59 | Attending: Family Medicine | Admitting: Family Medicine

## 2015-08-16 DIAGNOSIS — M79604 Pain in right leg: Secondary | ICD-10-CM | POA: Diagnosis not present

## 2015-08-16 DIAGNOSIS — M79601 Pain in right arm: Secondary | ICD-10-CM | POA: Diagnosis not present

## 2015-08-16 MED ORDER — METHYLPREDNISOLONE ACETATE 80 MG/ML IJ SUSP
80.0000 mg | Freq: Once | INTRAMUSCULAR | Status: AC
  Administered 2015-08-16: 80 mg via INTRAMUSCULAR

## 2015-08-16 MED ORDER — METHYLPREDNISOLONE ACETATE 80 MG/ML IJ SUSP
INTRAMUSCULAR | Status: AC
  Filled 2015-08-16: qty 1

## 2015-08-16 NOTE — Discharge Instructions (Signed)
You were given an injection of a steroid today. Recommend moist heat to area to help with discomfort. Recommend follow up with your primary care provider if symptoms do not improve in the next 3 to 5 days.

## 2015-08-16 NOTE — ED Triage Notes (Signed)
The patient presented to the [facility] with a complaint of right shoulder pain that started after she was walking her dog and it pulled her right arm.

## 2015-08-17 NOTE — ED Provider Notes (Signed)
CSN: [id number]     Arrival date & time 08/16/15  1709 History   First MD Initiated Contact with Patient 08/16/15 1854     Chief Complaint  Patient presents with  . Shoulder Pain   (Consider location/radiation/quality/duration/timing/severity/associated sxs/prior Treatment) 65 year old female presents with right upper arm pain that radiates into her shoulder that started 2-3 days ago. She is having more difficulty raising her right arm due to pain in her arm- not in her shoulder. She has tried moist heat and has taken Ibuprofen with minimal relief.    The history is provided by the patient.  Shoulder Pain  Location:  Arm Arm location:  R upper arm Injury: yes   Pain details:    Quality:  Shooting and aching   Radiates to:  R shoulder   Severity:  Moderate   Onset quality:  Gradual   Duration:  3 days   Timing:  Constant   Progression:  Unchanged Handedness:  Right-handed Dislocation: no   Ineffective treatments:  Rest and arthritis medication Associated symptoms: no fatigue, no fever, no numbness and no swelling     Past Medical History:  Diagnosis Date  . Abnormal Pap smear of cervix   . Anxiety   . Cataract, immature   . Chest pain   . [facility] DJD(carpometacarpal degenerative joint disease), localized primary 09/2012   left thumb  . Dental crowns present   . DOE (dyspnea on exertion)   . GERD (gastroesophageal reflux disease)   . Hyperkeratosis of cervix   . Hyperlipidemia   . Labial cyst    1 1/2 cm cyst on right sup labia maj  . Migraine headache   . Migraines   . Osteopenia   . Wears glasses    Past Surgical History:  Procedure Laterality Date  . CARPOMETACARPEL SUSPENSION PLASTY Right 12/23/2012   Procedure: RIGHT HAND TRAPEZIUM EXCISION WITH RIGHT THUMB CARPOMETACARPEL ([place]) SUSPENSION PLASTY;  Surgeon: Cammie Sickle., MD;  Location: [place];  Service: Orthopedics;  Laterality: Right;  . DORSAL COMPARTMENT RELEASE Left 09/11/2012   Procedure: TRAPEZIUM EXCISION FOR TRANSFER FIRST DORSAL RELEASE;  Surgeon: Cammie Sickle., MD;  Location: [place];  Service: Orthopedics;  Laterality: Left;  . NASAL FRACTURE SURGERY    . OTOPLASTY     as a child  . TONSILLECTOMY     as a child   Family History  Problem Relation Age of Onset  . Osteoporosis Mother   . Stroke Mother   . Hypertension Mother   . Heart attack Mother   . Stroke Maternal Grandmother   . Hypertension Maternal Grandmother   . Leukemia Father    Social History  Substance Use Topics  . Smoking status: Never Smoker  . Smokeless tobacco: Never Used  . Alcohol use 0.0 oz/week     Comment: rarely   OB History    Gravida Para Term Preterm AB Living   2 1       1    SAB TAB Ectopic Multiple Live Births                 Review of Systems  Constitutional: Negative for fatigue and fever.  Musculoskeletal: Positive for arthralgias and myalgias.  Neurological: Negative for numbness.    Allergies  Serzone [nefazodone]  Home Medications   Prior to Admission medications   Medication Sig Start Date End Date Taking? Authorizing Provider  ALPRAZolam Duanne Moron) 0.5 MG tablet  06/02/15  Yes  Historical Provider, MD  aspirin 81 MG tablet Take 81 mg by mouth daily.   Yes Historical Provider, MD  citalopram (CELEXA) 10 MG tablet Take 10 mg by mouth daily.   Yes Historical Provider, MD  clobetasol ointment (TEMOVATE) AB-123456789 % Apply 1 application topically 2 (two) times daily. 06/23/15  Yes Megan Salon, MD  omeprazole (PRILOSEC) 40 MG capsule Take 40 mg by mouth 2 (two) times daily.   Yes Historical Provider, MD  rosuvastatin (CRESTOR) 10 MG tablet Take 10 mg by mouth daily.   Yes Historical Provider, MD  traZODone (DESYREL) 50 MG tablet Take 50 mg by mouth at bedtime.   Yes Historical Provider, MD  sulfamethoxazole-trimethoprim (BACTRIM DS,SEPTRA DS) 800-160 MG tablet  05/06/15   Historical Provider, MD   Meds Ordered and Administered this Visit    Medications  methylPREDNISolone acetate (DEPO-MEDROL) injection 80 mg (80 mg Intramuscular Given 08/16/15 1917)    BP 147/68 (BP Location: Left Arm)   Pulse 67   Temp 98.2 F (36.8 C) (Oral)   Resp 18   LMP 01/02/1999   SpO2 100%  No data found.   Physical Exam  Constitutional: She is oriented to person, place, and time. She appears well-developed and well-nourished. No distress.  Neck: Normal range of motion. Neck supple.  Cardiovascular: Intact distal pulses.   Musculoskeletal: She exhibits tenderness.       Right shoulder: She exhibits decreased range of motion, pain and decreased strength. She exhibits no swelling and normal pulse.       Arms: Right upper arm in deltoid area tender. No swelling or redness. Unable to fully abduct shoulder due to pain in right arm. No numbness or sensory deficit. Pulses are normal.   Neurological: She is alert and oriented to person, place, and time. No sensory deficit.  Skin: Skin is warm and dry. Capillary refill takes less than 2 seconds.  Psychiatric: She has a normal mood and affect. Her behavior is normal. Judgment and thought content normal.    Urgent Care Course   Clinical Course    Procedures (including critical care time)  Labs Review Labs Reviewed - No data to display  Imaging Review No results found.   Visual Acuity Review  Right Eye Distance:   Left Eye Distance:   Bilateral Distance:    Right Eye Near:   Left Eye Near:    Bilateral Near:         MDM   1. Arm pain, musculoskeletal, right    Gave DepMedrol 80mg  IM in the affected deltoid muscle. Recommend warm moist heat to the area for comfort. May take Aleve 2 tablet every 12 hours as needed for pain/discomfort. Recommend follow-up with her primary care provider or Orthopedic if symptoms do not improve within 1 week.     Katy Apo, NP 08/17/15 1016

## 2015-08-24 MED FILL — OMEPRAZOLE DR 40 MG CAPSULE: 40 | 90 days supply | Qty: 180 | Fill #2

## 2015-08-30 MED FILL — ALPRAZolam 0.5 MG TABS: 0.5 | 90 days supply | Qty: 90 | Fill #1

## 2015-09-07 MED FILL — CITALOPRAM HBR 40 MG TABLET: 40 | 90 days supply | Qty: 90 | Fill #0

## 2015-09-19 MED FILL — ROSUVASTATIN CALCIUM 10 MG: 10 | 90 days supply | Qty: 90 | Fill #0

## 2015-09-20 MED FILL — BUTALBITAL/ACETAMINOPHEN 50: 50-325 | 30 days supply | Qty: 60 | Fill #0

## 2015-09-23 ENCOUNTER — Encounter: Payer: Self-pay | Admitting: Internal Medicine

## 2015-10-24 MED FILL — TEMAZEPAM 30 MG CAPSULE: 30 | 90 days supply | Qty: 90 | Fill #0

## 2015-12-01 MED FILL — ALPRAZolam 0.5 MG TABS: 0.5 | 90 days supply | Qty: 90 | Fill #0

## 2015-12-08 MED FILL — CITALOPRAM HBR 40 MG TABLET: 40 | 90 days supply | Qty: 90 | Fill #1

## 2015-12-13 MED FILL — ROSUVASTATIN CALCIUM 10 MG: 10 | 90 days supply | Qty: 90 | Fill #1

## 2016-01-16 MED FILL — OMEPRAZOLE DR 40 MG CAPSULE: 40 | 90 days supply | Qty: 180 | Fill #3

## 2016-01-20 MED FILL — TEMAZEPAM 30 MG CAPSULE: 30 | 90 days supply | Qty: 90 | Fill #1

## 2016-02-22 MED FILL — SUMATRIPTAN SUCC 50 MG TAB: 50 | 30 days supply | Qty: 18 | Fill #0

## 2016-02-29 MED FILL — NITROFURANTOIN MONO-MCR 100: 100 | 5 days supply | Qty: 10 | Fill #0

## 2016-02-29 MED FILL — ALPRAZolam 0.5 MG TABS: 0.5 | 90 days supply | Qty: 90 | Fill #1

## 2016-03-08 DIAGNOSIS — Z79899 Other long term (current) drug therapy: Secondary | ICD-10-CM | POA: Diagnosis not present

## 2016-03-08 DIAGNOSIS — M79676 Pain in unspecified toe(s): Secondary | ICD-10-CM | POA: Diagnosis not present

## 2016-03-08 DIAGNOSIS — Z6820 Body mass index (BMI) 20.0-20.9, adult: Secondary | ICD-10-CM | POA: Diagnosis not present

## 2016-03-08 DIAGNOSIS — I7300 Raynaud's syndrome without gangrene: Secondary | ICD-10-CM | POA: Diagnosis not present

## 2016-03-08 DIAGNOSIS — E559 Vitamin D deficiency, unspecified: Secondary | ICD-10-CM | POA: Diagnosis not present

## 2016-03-08 DIAGNOSIS — G629 Polyneuropathy, unspecified: Secondary | ICD-10-CM | POA: Diagnosis not present

## 2016-03-08 MED FILL — CITALOPRAM HBR 40 MG TABLET: 40 | 90 days supply | Qty: 90 | Fill #2

## 2016-03-08 MED FILL — ROSUVASTATIN CALCIUM 10 MG: 10 | 90 days supply | Qty: 90 | Fill #2

## 2016-03-28 DIAGNOSIS — H5213 Myopia, bilateral: Secondary | ICD-10-CM | POA: Diagnosis not present

## 2016-03-28 DIAGNOSIS — H2513 Age-related nuclear cataract, bilateral: Secondary | ICD-10-CM | POA: Diagnosis not present

## 2016-04-23 MED FILL — OMEPRAZOLE DR 40 MG CAPSULE: 40 | 90 days supply | Qty: 180 | Fill #0

## 2016-04-23 MED FILL — TEMAZEPAM 30 MG CAPSULE: 30 | 90 days supply | Qty: 90 | Fill #0

## 2016-06-04 MED FILL — ALPRAZolam 0.5 MG TABS: 0.5 | 90 days supply | Qty: 90 | Fill #0

## 2016-06-05 MED FILL — CITALOPRAM HBR 40 MG TABLET: 40 | 90 days supply | Qty: 90 | Fill #3

## 2016-06-05 MED FILL — ROSUVASTATIN CALCIUM 10 MG: 10 | 90 days supply | Qty: 90 | Fill #3

## 2016-06-08 ENCOUNTER — Other Ambulatory Visit: Payer: Self-pay | Admitting: Obstetrics & Gynecology

## 2016-06-08 DIAGNOSIS — Z1231 Encounter for screening mammogram for malignant neoplasm of breast: Secondary | ICD-10-CM

## 2016-06-27 ENCOUNTER — Ambulatory Visit: Payer: 59

## 2016-06-27 ENCOUNTER — Ambulatory Visit
Admission: RE | Admit: 2016-06-27 | Discharge: 2016-06-27 | Disposition: A | Discharge status: Home / Self Care | Payer: 59 | Source: Ambulatory Visit | Attending: Obstetrics & Gynecology | Admitting: Obstetrics & Gynecology

## 2016-06-27 DIAGNOSIS — Z1231 Encounter for screening mammogram for malignant neoplasm of breast: Secondary | ICD-10-CM

## 2016-07-03 NOTE — Progress Notes (Signed)
66 y.o. G2P1 SingleCaucasianF here for annual exam.  Doing well.  No vaginal bleeding.    PCP:  Dr. Dagmar Hait.  Will do blood work in August.    Patient's last menstrual period was 01/02/1999.          Sexually active: Yes.    The current method of family planning is post menopausal status.    Exercising: Yes.    walking Smoker:  no  Health Maintenance: Pap:  06/23/15 negative, 03/27/13 negative  History of abnormal Pap:  yes MMG:  06/27/16 BIRADS 1 negative  Colonoscopy:  >10 years.  Pt knows she is overdue.   BMD:   04/10/13 osteopenia  TDaP:  UTD with PCP  Pneumonia vaccine(s):  never Zostavax:   never Hep C testing: 06/23/15 negative  Screening Labs: PCP, Hb today: PCP   reports that she has never smoked. She has never used smokeless tobacco. She reports that she drinks alcohol. She reports that she does not use drugs.  Past Medical History:  Diagnosis Date  . Abnormal Pap smear of cervix   . Anxiety   . Cataract, immature   . Chest pain   . [facility] DJD(carpometacarpal degenerative joint disease), localized primary 09/2012   left thumb  . Dental crowns present   . DOE (dyspnea on exertion)   . GERD (gastroesophageal reflux disease)   . Hyperkeratosis of cervix   . Hyperlipidemia   . Labial cyst    1 1/2 cm cyst on right sup labia maj  . Migraine headache   . Migraines   . Osteopenia   . Wears glasses     Past Surgical History:  Procedure Laterality Date  . CARPOMETACARPEL SUSPENSION PLASTY Right 12/23/2012   Procedure: RIGHT HAND TRAPEZIUM EXCISION WITH RIGHT THUMB CARPOMETACARPEL ([place]) SUSPENSION PLASTY;  Surgeon: Cammie Sickle., MD;  Location: [place];  Service: Orthopedics;  Laterality: Right;  . DORSAL COMPARTMENT RELEASE Left 09/11/2012   Procedure: TRAPEZIUM EXCISION FOR TRANSFER FIRST DORSAL RELEASE;  Surgeon: Cammie Sickle., MD;  Location: [place];  Service: Orthopedics;  Laterality: Left;  . NASAL FRACTURE SURGERY    .  OTOPLASTY     as a child  . TONSILLECTOMY     as a child    Current Outpatient Prescriptions  Medication Sig Dispense Refill  . ALPRAZolam (XANAX) 0.5 MG tablet   2  . aspirin 81 MG tablet Take 81 mg by mouth daily.    . citalopram (CELEXA) 10 MG tablet Take 10 mg by mouth daily.    . clobetasol ointment (TEMOVATE) 9.92 % Apply 1 application topically 2 (two) times daily. 30 g 0  . omeprazole (PRILOSEC) 40 MG capsule Take 40 mg by mouth 2 (two) times daily.    . rosuvastatin (CRESTOR) 10 MG tablet Take 10 mg by mouth daily.    Marland Kitchen sulfamethoxazole-trimethoprim (BACTRIM DS,SEPTRA DS) 800-160 MG tablet   0  . temazepam (RESTORIL) 30 MG capsule     . traZODone (DESYREL) 50 MG tablet Take 50 mg by mouth at bedtime.     No current facility-administered medications for this visit.     Family History  Problem Relation Age of Onset  . Osteoporosis Mother   . Stroke Mother   . Hypertension Mother   . Heart attack Mother   . Stroke Maternal Grandmother   . Hypertension Maternal Grandmother   . Leukemia Father     ROS:  Pertinent items are noted in HPI.  Otherwise, a comprehensive ROS was negative.  Exam:   BP 124/76 (BP Location: Right Arm, Patient Position: Sitting, Cuff Size: Normal)   Pulse 78   Resp 12   Ht 5' 2.5" (1.588 m)   Wt 113 lb 8 oz (51.5 kg)   LMP 01/02/1999   BMI 20.43 kg/m    Height: 5' 2.5" (158.8 cm)  Ht Readings from Last 3 Encounters:  07/05/16 5' 2.5" (1.588 m)  06/23/15 5' 2.5" (1.588 m)  04/08/14 5' 2.34" (1.583 m)    General appearance: alert, cooperative and appears stated age Head: Normocephalic, without obvious abnormality, atraumatic Neck: no adenopathy, supple, symmetrical, trachea midline and thyroid normal to inspection and palpation Lungs: clear to auscultation bilaterally Breasts: normal appearance, no masses or tenderness Heart: regular rate and rhythm Abdomen: soft, non-tender; bowel sounds normal; no masses,  no organomegaly Extremities:  extremities normal, atraumatic, no cyanosis or edema Skin: Skin color, texture, turgor normal. No rashes or lesions Lymph nodes: Cervical, supraclavicular, and axillary nodes normal. No abnormal inguinal nodes palpated Neurologic: Grossly normal   Pelvic: External genitalia:  no lesions              Urethra:  normal appearing urethra with no masses, tenderness or lesions              Bartholins and Skenes: normal                 Vagina: normal appearing vagina with normal color and discharge, no lesions              Cervix: no lesions              Pap taken: No. Bimanual Exam:  Uterus:  normal size, contour, position, consistency, mobility, non-tender              Adnexa: normal adnexa and no mass, fullness, tenderness               Rectovaginal: Confirms               Anus:  normal sphincter tone, no lesions  Chaperone was present for exam.  A:  Well Woman with normal exam PMP, no HRT Atrophic changes LS&A Migraines Elevated lipids  P:   Mammogram yearly pap smear not indicated.  Neg 2017. Lab work will be done with Dr. Dagmar Hait in August Discussed colonoscopy vs Cologuardd with pt.  Information given. D/w pt shingrix vaccination. return annually or prn  '

## 2016-07-05 ENCOUNTER — Ambulatory Visit (INDEPENDENT_AMBULATORY_CARE_PROVIDER_SITE_OTHER): Payer: 59 | Admitting: Obstetrics & Gynecology

## 2016-07-05 ENCOUNTER — Encounter: Payer: Self-pay | Admitting: Obstetrics & Gynecology

## 2016-07-05 VITALS — BP 124/76 | HR 78 | Resp 12 | Ht 62.5 in | Wt 113.5 lb

## 2016-07-05 DIAGNOSIS — Z01419 Encounter for gynecological examination (general) (routine) without abnormal findings: Secondary | ICD-10-CM | POA: Diagnosis not present

## 2016-07-05 NOTE — Patient Instructions (Addendum)
Cologuard  Consider the Shingrix vaccine

## 2016-07-23 MED FILL — TEMAZEPAM 30 MG CAPSULE: 30 | 90 days supply | Qty: 90 | Fill #1

## 2016-07-23 MED FILL — OMEPRAZOLE DR 40 MG CAPSULE: 40 | 90 days supply | Qty: 180 | Fill #1

## 2016-07-25 DIAGNOSIS — Z0000 Encounter for general adult medical examination without abnormal findings: Secondary | ICD-10-CM | POA: Diagnosis not present

## 2016-08-01 DIAGNOSIS — F4325 Adjustment disorder with mixed disturbance of emotions and conduct: Secondary | ICD-10-CM | POA: Diagnosis not present

## 2016-08-01 DIAGNOSIS — K219 Gastro-esophageal reflux disease without esophagitis: Secondary | ICD-10-CM | POA: Diagnosis not present

## 2016-08-01 DIAGNOSIS — I7300 Raynaud's syndrome without gangrene: Secondary | ICD-10-CM | POA: Diagnosis not present

## 2016-08-01 DIAGNOSIS — Z0000 Encounter for general adult medical examination without abnormal findings: Secondary | ICD-10-CM | POA: Diagnosis not present

## 2016-08-01 DIAGNOSIS — G43909 Migraine, unspecified, not intractable, without status migrainosus: Secondary | ICD-10-CM | POA: Diagnosis not present

## 2016-08-01 DIAGNOSIS — Z1389 Encounter for screening for other disorder: Secondary | ICD-10-CM | POA: Diagnosis not present

## 2016-08-01 DIAGNOSIS — E559 Vitamin D deficiency, unspecified: Secondary | ICD-10-CM | POA: Diagnosis not present

## 2016-08-01 DIAGNOSIS — Z23 Encounter for immunization: Secondary | ICD-10-CM | POA: Diagnosis not present

## 2016-08-01 DIAGNOSIS — E784 Other hyperlipidemia: Secondary | ICD-10-CM | POA: Diagnosis not present

## 2016-08-09 MED FILL — SULFAMETHOXAZOLE/TMP DS TAB: 800-160 | 45 days supply | Qty: 90 | Fill #0 | Status: TO

## 2016-09-04 MED FILL — ROSUVASTATIN CALCIUM 10 MG: 10 | 90 days supply | Qty: 90 | Fill #0

## 2016-09-04 MED FILL — CITALOPRAM HBR 40 MG TABLET: 40 | 90 days supply | Qty: 90 | Fill #0

## 2016-09-04 MED FILL — ALPRAZolam 0.5 MG TABS: 0.5 | 90 days supply | Qty: 90 | Fill #1

## 2016-09-27 ENCOUNTER — Ambulatory Visit: Payer: 59 | Admitting: Obstetrics & Gynecology

## 2016-09-27 MED FILL — DIVALPROEX SOD DR 250 MG TA: 250 | 30 days supply | Qty: 30 | Fill #0

## 2016-10-24 ENCOUNTER — Other Ambulatory Visit: Payer: Self-pay | Admitting: Obstetrics & Gynecology

## 2016-10-24 MED FILL — HYDROCHLOROTHIAZIDE 25 MG T: 25 | 90 days supply | Qty: 90 | Fill #0

## 2016-10-24 MED FILL — CLOBETASOL 0.05% OINTMENT: 0.05 | 15 days supply | Qty: 30 | Fill #0

## 2016-10-24 MED FILL — BUTALBITAL/ACETAMINOPHEN 50: 50-325 | 30 days supply | Qty: 60 | Fill #0 | Status: TO

## 2016-10-24 NOTE — Telephone Encounter (Signed)
Medication refill request: clobetasol oint  Last AEX:  07-05-16  Next AEX: 09-19-17  Last MMG (if hormonal medication request): 06-27-16 WNL  Refill authorized: please advise

## 2016-10-26 MED FILL — OMEPRAZOLE DR 40 MG CAPSULE: 40 | 90 days supply | Qty: 180 | Fill #2

## 2016-10-30 MED FILL — TEMAZEPAM 30 MG CAPSULE: 30 | 90 days supply | Qty: 90 | Fill #0

## 2016-12-03 MED FILL — CITALOPRAM HBR 40 MG TABLET: 40 | 90 days supply | Qty: 90 | Fill #1

## 2016-12-03 MED FILL — ROSUVASTATIN CALCIUM 10 MG: 10 | 90 days supply | Qty: 90 | Fill #1

## 2016-12-03 MED FILL — ALPRAZolam 0.5 MG TABS: 0.5 | 90 days supply | Qty: 90 | Fill #0

## 2016-12-11 MED FILL — METHYLPREDNISOLONE 4 MG TAB: 4 | 6 days supply | Qty: 21 | Fill #0

## 2016-12-19 DIAGNOSIS — Z6820 Body mass index (BMI) 20.0-20.9, adult: Secondary | ICD-10-CM | POA: Diagnosis not present

## 2016-12-19 DIAGNOSIS — L709 Acne, unspecified: Secondary | ICD-10-CM | POA: Diagnosis not present

## 2016-12-19 MED FILL — DOXYCYCLINE HYC 50 MG CAP: 50 | 17 days supply | Qty: 24 | Fill #0

## 2017-01-24 MED FILL — OMEPRAZOLE DR 40 MG CAPSULE: 40 | 90 days supply | Qty: 180 | Fill #3

## 2017-01-29 MED FILL — TEMAZEPAM 30 MG CAPSULE: 30 | 29 days supply | Qty: 29 | Fill #1

## 2017-02-06 DIAGNOSIS — G43909 Migraine, unspecified, not intractable, without status migrainosus: Secondary | ICD-10-CM | POA: Diagnosis not present

## 2017-02-06 DIAGNOSIS — Z1389 Encounter for screening for other disorder: Secondary | ICD-10-CM | POA: Diagnosis not present

## 2017-02-06 DIAGNOSIS — R69 Illness, unspecified: Secondary | ICD-10-CM | POA: Diagnosis not present

## 2017-02-06 DIAGNOSIS — Z6820 Body mass index (BMI) 20.0-20.9, adult: Secondary | ICD-10-CM | POA: Diagnosis not present

## 2017-02-06 MED FILL — SUMATRIPTAN SUCC 50 MG TABL: 50 | 30 days supply | Qty: 12 | Fill #0 | Status: TO

## 2017-02-06 MED FILL — buPROPion HCL 75 MG TABS: 75 | 30 days supply | Qty: 30 | Fill #0

## 2017-03-01 MED FILL — CITALOPRAM HBR 40 MG TABLET: 40 | 90 days supply | Qty: 90 | Fill #2

## 2017-03-01 MED FILL — TEMAZEPAM 30 MG CAPSULE: 30 | 61 days supply | Qty: 61 | Fill #2

## 2017-03-01 MED FILL — ROSUVASTATIN CALCIUM 10 MG: 10 | 90 days supply | Qty: 90 | Fill #2

## 2017-03-01 MED FILL — buPROPion HCL 75 MG TABS: 75 | 90 days supply | Qty: 180 | Fill #0 | Status: TO

## 2017-03-01 MED FILL — ALPRAZolam 0.5 MG TABS: 0.5 | 90 days supply | Qty: 90 | Fill #1

## 2017-04-10 DIAGNOSIS — H5213 Myopia, bilateral: Secondary | ICD-10-CM | POA: Diagnosis not present

## 2017-04-10 DIAGNOSIS — H2513 Age-related nuclear cataract, bilateral: Secondary | ICD-10-CM | POA: Diagnosis not present

## 2017-06-12 DIAGNOSIS — R69 Illness, unspecified: Secondary | ICD-10-CM | POA: Diagnosis not present

## 2017-07-03 DIAGNOSIS — R69 Illness, unspecified: Secondary | ICD-10-CM | POA: Diagnosis not present

## 2017-07-10 DIAGNOSIS — R69 Illness, unspecified: Secondary | ICD-10-CM | POA: Diagnosis not present

## 2017-07-31 DIAGNOSIS — R69 Illness, unspecified: Secondary | ICD-10-CM | POA: Diagnosis not present

## 2017-08-22 ENCOUNTER — Telehealth: Payer: Self-pay | Admitting: Obstetrics & Gynecology

## 2017-08-22 NOTE — Telephone Encounter (Signed)
Left a voicemail for patient on 08/22/17 regarding the rescheduling of appointment on 09/19/17. Patient in recall.

## 2017-08-28 DIAGNOSIS — E559 Vitamin D deficiency, unspecified: Secondary | ICD-10-CM | POA: Diagnosis not present

## 2017-08-28 DIAGNOSIS — E7849 Other hyperlipidemia: Secondary | ICD-10-CM | POA: Diagnosis not present

## 2017-08-28 DIAGNOSIS — R82998 Other abnormal findings in urine: Secondary | ICD-10-CM | POA: Diagnosis not present

## 2017-09-04 DIAGNOSIS — I7300 Raynaud's syndrome without gangrene: Secondary | ICD-10-CM | POA: Diagnosis not present

## 2017-09-04 DIAGNOSIS — Z1389 Encounter for screening for other disorder: Secondary | ICD-10-CM | POA: Diagnosis not present

## 2017-09-04 DIAGNOSIS — R69 Illness, unspecified: Secondary | ICD-10-CM | POA: Diagnosis not present

## 2017-09-04 DIAGNOSIS — G43909 Migraine, unspecified, not intractable, without status migrainosus: Secondary | ICD-10-CM | POA: Diagnosis not present

## 2017-09-04 DIAGNOSIS — E7849 Other hyperlipidemia: Secondary | ICD-10-CM | POA: Diagnosis not present

## 2017-09-04 DIAGNOSIS — Z0000 Encounter for general adult medical examination without abnormal findings: Secondary | ICD-10-CM | POA: Diagnosis not present

## 2017-09-04 DIAGNOSIS — K219 Gastro-esophageal reflux disease without esophagitis: Secondary | ICD-10-CM | POA: Diagnosis not present

## 2017-09-04 DIAGNOSIS — Z681 Body mass index (BMI) 19 or less, adult: Secondary | ICD-10-CM | POA: Diagnosis not present

## 2017-09-04 DIAGNOSIS — R0989 Other specified symptoms and signs involving the circulatory and respiratory systems: Secondary | ICD-10-CM | POA: Diagnosis not present

## 2017-09-04 DIAGNOSIS — E559 Vitamin D deficiency, unspecified: Secondary | ICD-10-CM | POA: Diagnosis not present

## 2017-09-06 ENCOUNTER — Encounter (HOSPITAL_COMMUNITY): Payer: 59

## 2017-09-09 ENCOUNTER — Ambulatory Visit (HOSPITAL_COMMUNITY)
Admission: RE | Admit: 2017-09-09 | Discharge: 2017-09-09 | Disposition: A | Discharge status: Home / Self Care | Payer: Medicare HMO | Source: Ambulatory Visit | Attending: Family | Admitting: Family

## 2017-09-09 ENCOUNTER — Other Ambulatory Visit (HOSPITAL_COMMUNITY): Payer: Self-pay | Admitting: Internal Medicine

## 2017-09-09 DIAGNOSIS — R0989 Other specified symptoms and signs involving the circulatory and respiratory systems: Secondary | ICD-10-CM | POA: Insufficient documentation

## 2017-09-19 ENCOUNTER — Encounter

## 2017-09-19 ENCOUNTER — Ambulatory Visit: Payer: Self-pay | Admitting: Obstetrics & Gynecology

## 2017-10-01 ENCOUNTER — Encounter: Payer: Self-pay | Admitting: Obstetrics & Gynecology

## 2017-10-01 ENCOUNTER — Ambulatory Visit (INDEPENDENT_AMBULATORY_CARE_PROVIDER_SITE_OTHER): Payer: Medicare HMO | Admitting: Obstetrics & Gynecology

## 2017-10-01 VITALS — BP 126/60 | HR 80 | Resp 16 | Ht 62.5 in | Wt 113.2 lb

## 2017-10-01 DIAGNOSIS — E785 Hyperlipidemia, unspecified: Secondary | ICD-10-CM | POA: Diagnosis not present

## 2017-10-01 DIAGNOSIS — Z01419 Encounter for gynecological examination (general) (routine) without abnormal findings: Secondary | ICD-10-CM

## 2017-10-01 DIAGNOSIS — L900 Lichen sclerosus et atrophicus: Secondary | ICD-10-CM | POA: Diagnosis not present

## 2017-10-01 NOTE — Progress Notes (Signed)
67 y.o. G2P1 Single White or Caucasian female here for annual exam.  Doing well.  Denies vaginal bleeding.  Had "turbulence" on exam with Dr. Dagmar Hait and had carotid doppler done.  This was normal.  Please about this.  Denies vaginal bleeding.  PCP:  Dr. Dagmar Hait.  Just saw him a few weeks ago.     Patient's last menstrual period was 01/02/1999.          Sexually active: Yes.    The current method of family planning is post menopausal status.    Exercising: Yes.    walking Smoker:  no  Health Maintenance: Pap:  06/23/15 Neg   03/27/13 Neg  History of abnormal Pap:  yes MMG:  06/27/16 BIRADS1:neg  Colonoscopy:  >10 years.  Cologuard has been ordered for pt BMD:   04/10/13 Osteopenia TDaP:  Current  Pneumonia vaccine(s):  No Shingrix:   No Hep C testing: 06/23/15 Neg  Screening Labs: PCP   reports that she has never smoked. She has never used smokeless tobacco. She reports that she drinks about 2.0 standard drinks of alcohol per week. She reports that she does not use drugs.  Past Medical History:  Diagnosis Date  . Abnormal Pap smear of cervix   . Anxiety   . Cataract, immature   . Chest pain   . [facility] DJD(carpometacarpal degenerative joint disease), localized primary 09/2012   left thumb  . Dental crowns present   . DOE (dyspnea on exertion)   . GERD (gastroesophageal reflux disease)   . Hyperkeratosis of cervix   . Hyperlipidemia   . Labial cyst    1 1/2 cm cyst on right sup labia maj  . Migraine headache   . Migraines   . Osteopenia   . Wears glasses     Past Surgical History:  Procedure Laterality Date  . CARPOMETACARPEL SUSPENSION PLASTY Right 12/23/2012   Procedure: RIGHT HAND TRAPEZIUM EXCISION WITH RIGHT THUMB CARPOMETACARPEL ([place]) SUSPENSION PLASTY;  Surgeon: Cammie Sickle., MD;  Location: [place];  Service: Orthopedics;  Laterality: Right;  . DORSAL COMPARTMENT RELEASE Left 09/11/2012   Procedure: TRAPEZIUM EXCISION FOR TRANSFER FIRST DORSAL  RELEASE;  Surgeon: Cammie Sickle., MD;  Location: [place];  Service: Orthopedics;  Laterality: Left;  . NASAL FRACTURE SURGERY    . OTOPLASTY     as a child  . TONSILLECTOMY     as a child    Current Outpatient Medications  Medication Sig Dispense Refill  . ALPRAZolam (XANAX) 0.5 MG tablet   2  . aspirin 81 MG tablet Take 81 mg by mouth daily.    . clobetasol ointment (TEMOVATE) 0.05 % APPLY TOPICALLY 2 TIMES DAILY. 30 g 0  . escitalopram (LEXAPRO) 20 MG tablet Take 20 mg by mouth daily.  1  . omeprazole (PRILOSEC) 40 MG capsule Take 40 mg by mouth 2 (two) times daily.    . rosuvastatin (CRESTOR) 10 MG tablet Take 10 mg by mouth daily.    . temazepam (RESTORIL) 30 MG capsule      No current facility-administered medications for this visit.     Family History  Problem Relation Age of Onset  . Osteoporosis Mother   . Stroke Mother   . Hypertension Mother   . Heart attack Mother   . Stroke Maternal Grandmother   . Hypertension Maternal Grandmother   . Leukemia Father     Review of Systems  All other systems reviewed and are  negative.   Exam:   BP 126/60 (BP Location: Right Arm, Patient Position: Sitting, Cuff Size: Normal)   Pulse 80   Resp 16   Ht 5' 2.5" (1.588 m)   Wt 113 lb 3.2 oz (51.3 kg)   LMP 01/02/1999   BMI 20.37 kg/m     Height: 5' 2.5" (158.8 cm)  Ht Readings from Last 3 Encounters:  10/01/17 5' 2.5" (1.588 m)  07/05/16 5' 2.5" (1.588 m)  06/23/15 5' 2.5" (1.588 m)    General appearance: alert, cooperative and appears stated age Head: Normocephalic, without obvious abnormality, atraumatic Neck: no adenopathy, supple, symmetrical, trachea midline and thyroid normal to inspection and palpation Lungs: clear to auscultation bilaterally Breasts: normal appearance, no masses or tenderness Heart: regular rate and rhythm Abdomen: soft, non-tender; bowel sounds normal; no masses,  no organomegaly Extremities: extremities normal,  atraumatic, no cyanosis or edema Skin: Skin color, texture, turgor normal. No rashes or lesions Lymph nodes: Cervical, supraclavicular, and axillary nodes normal. No abnormal inguinal nodes palpated Neurologic: Grossly normal   Pelvic: External genitalia:  no lesions              Urethra:  normal appearing urethra with no masses, tenderness or lesions              Bartholins and Skenes: normal                 Vagina: normal appearing vagina with normal color and discharge, no lesions              Cervix: no lesions              Pap taken: No. Bimanual Exam:  Uterus:  normal size, contour, position, consistency, mobility, non-tender              Adnexa: normal adnexa and no mass, fullness, tenderness               Rectovaginal: Confirms               Anus:  normal sphincter tone, no lesions  Chaperone was present for exam.  A:  Well Woman with normal exam PMP, no HRT Vaginal atrophy H/O lichen sclerosus Migraines H/O elevated lipids  P:   Mammogram guidelines reviewed.  She is doing yearly MMGs pap smear not indicated.  Neg 2017 Rx for Prevnar vaccination given as she has not done this year shingrix vaccination discussed.  Will check insurance. Declines another colonoscopy.  Reports cologuard has been ordered for her. Lab work done with Dr. Dagmar Hait Does not need clobetasol rx at this time RTC 1 year or prn new issues/problems

## 2017-10-01 NOTE — Patient Instructions (Signed)
[facility] Phone: [phone] Location: Lower level of [facility] ([address]) Hours: 7:30 a.m. to 6:00 p.m., Monday through Friday.   [facility] Phone: [phone] Location: [address] Hours: 7:30 a.m. to 6:00 p.m., Monday through Friday.  

## 2017-10-04 DIAGNOSIS — E785 Hyperlipidemia, unspecified: Secondary | ICD-10-CM | POA: Insufficient documentation

## 2017-10-04 DIAGNOSIS — L900 Lichen sclerosus et atrophicus: Secondary | ICD-10-CM | POA: Insufficient documentation

## 2017-12-18 DIAGNOSIS — R69 Illness, unspecified: Secondary | ICD-10-CM | POA: Diagnosis not present

## 2017-12-23 DIAGNOSIS — R69 Illness, unspecified: Secondary | ICD-10-CM | POA: Diagnosis not present

## 2018-01-16 ENCOUNTER — Ambulatory Visit (INDEPENDENT_AMBULATORY_CARE_PROVIDER_SITE_OTHER): Payer: Self-pay | Admitting: Plastic Surgery

## 2018-01-16 ENCOUNTER — Encounter: Payer: Self-pay | Admitting: Plastic Surgery

## 2018-01-16 DIAGNOSIS — Z719 Counseling, unspecified: Secondary | ICD-10-CM | POA: Insufficient documentation

## 2018-01-16 NOTE — Progress Notes (Signed)
Preoperative Dx: hyperpigmentation  Postoperative Dx:  same  Procedure: laser to face   Anesthesia: none  Description of Procedure:  Risks and complications were explained to the patient. Consent was confirmed and signed. Time out was called and all information was confirmed to be correct. The area  area was prepped with alcohol and wiped dry. The IPL laser was set at 240 Joules/cm2 and 16 ms. The face was lasered. The patient tolerated the procedure well and there were no complications. The patient is to follow up in 4 weeks.

## 2018-02-27 ENCOUNTER — Other Ambulatory Visit: Payer: Self-pay | Admitting: Obstetrics & Gynecology

## 2018-02-27 NOTE — Telephone Encounter (Signed)
Medication refill request: Clobetasol ointment Last AEX:  10-01-17 SM  Next AEX: 02-10-19 Last MMG (if hormonal medication request): 06-27-16 density B/BIRADS 1 negative  Refill authorized: 10-24-16 #30g, 0RF. Please advise.   Medication pended for #30g, 0RF to [facility] on [address] per patient request. Please refill if appropriate.

## 2018-02-27 NOTE — Telephone Encounter (Signed)
Patient is asking for a refill of clobetasol ointment to [facility] on [address].

## 2018-02-28 MED ORDER — CLOBETASOL PROPIONATE 0.05 % EX OINT: TOPICAL_OINTMENT | CUTANEOUS | 0 refills | Status: DC

## 2018-05-28 DIAGNOSIS — H2513 Age-related nuclear cataract, bilateral: Secondary | ICD-10-CM | POA: Diagnosis not present

## 2018-05-28 DIAGNOSIS — H5213 Myopia, bilateral: Secondary | ICD-10-CM | POA: Diagnosis not present

## 2018-07-10 DIAGNOSIS — H2512 Age-related nuclear cataract, left eye: Secondary | ICD-10-CM | POA: Diagnosis not present

## 2018-07-10 DIAGNOSIS — H25812 Combined forms of age-related cataract, left eye: Secondary | ICD-10-CM | POA: Diagnosis not present

## 2018-07-17 DIAGNOSIS — H2511 Age-related nuclear cataract, right eye: Secondary | ICD-10-CM | POA: Diagnosis not present

## 2018-07-17 DIAGNOSIS — H25811 Combined forms of age-related cataract, right eye: Secondary | ICD-10-CM | POA: Diagnosis not present

## 2018-08-05 ENCOUNTER — Other Ambulatory Visit: Payer: Self-pay | Admitting: Obstetrics & Gynecology

## 2018-08-05 DIAGNOSIS — Z1231 Encounter for screening mammogram for malignant neoplasm of breast: Secondary | ICD-10-CM

## 2018-08-14 ENCOUNTER — Other Ambulatory Visit: Payer: Self-pay

## 2018-08-14 ENCOUNTER — Ambulatory Visit
Admission: RE | Admit: 2018-08-14 | Discharge: 2018-08-14 | Disposition: A | Discharge status: Home / Self Care | Payer: Medicare HMO | Source: Ambulatory Visit | Attending: Obstetrics & Gynecology | Admitting: Obstetrics & Gynecology

## 2018-08-14 DIAGNOSIS — Z1231 Encounter for screening mammogram for malignant neoplasm of breast: Secondary | ICD-10-CM | POA: Diagnosis not present

## 2018-08-25 DIAGNOSIS — R69 Illness, unspecified: Secondary | ICD-10-CM | POA: Diagnosis not present

## 2018-09-02 DIAGNOSIS — E559 Vitamin D deficiency, unspecified: Secondary | ICD-10-CM | POA: Diagnosis not present

## 2018-09-02 DIAGNOSIS — E7849 Other hyperlipidemia: Secondary | ICD-10-CM | POA: Diagnosis not present

## 2018-09-02 DIAGNOSIS — Z23 Encounter for immunization: Secondary | ICD-10-CM | POA: Diagnosis not present

## 2018-09-03 ENCOUNTER — Encounter: Payer: Self-pay | Admitting: Obstetrics & Gynecology

## 2018-09-03 DIAGNOSIS — Z1211 Encounter for screening for malignant neoplasm of colon: Secondary | ICD-10-CM | POA: Diagnosis not present

## 2018-09-03 DIAGNOSIS — Z1212 Encounter for screening for malignant neoplasm of rectum: Secondary | ICD-10-CM | POA: Diagnosis not present

## 2018-09-03 LAB — COLOGUARD

## 2018-09-10 DIAGNOSIS — R0989 Other specified symptoms and signs involving the circulatory and respiratory systems: Secondary | ICD-10-CM | POA: Diagnosis not present

## 2018-09-10 DIAGNOSIS — I7300 Raynaud's syndrome without gangrene: Secondary | ICD-10-CM | POA: Diagnosis not present

## 2018-09-10 DIAGNOSIS — K219 Gastro-esophageal reflux disease without esophagitis: Secondary | ICD-10-CM | POA: Diagnosis not present

## 2018-09-10 DIAGNOSIS — Z0000 Encounter for general adult medical examination without abnormal findings: Secondary | ICD-10-CM | POA: Diagnosis not present

## 2018-09-10 DIAGNOSIS — R69 Illness, unspecified: Secondary | ICD-10-CM | POA: Diagnosis not present

## 2018-09-10 DIAGNOSIS — E559 Vitamin D deficiency, unspecified: Secondary | ICD-10-CM | POA: Diagnosis not present

## 2018-09-10 DIAGNOSIS — G43909 Migraine, unspecified, not intractable, without status migrainosus: Secondary | ICD-10-CM | POA: Diagnosis not present

## 2018-09-10 DIAGNOSIS — Z1331 Encounter for screening for depression: Secondary | ICD-10-CM | POA: Diagnosis not present

## 2018-09-10 DIAGNOSIS — E785 Hyperlipidemia, unspecified: Secondary | ICD-10-CM | POA: Diagnosis not present

## 2018-09-19 DIAGNOSIS — Z961 Presence of intraocular lens: Secondary | ICD-10-CM | POA: Diagnosis not present

## 2018-09-26 DIAGNOSIS — N390 Urinary tract infection, site not specified: Secondary | ICD-10-CM | POA: Diagnosis not present

## 2018-10-02 DIAGNOSIS — R300 Dysuria: Secondary | ICD-10-CM | POA: Diagnosis not present

## 2019-02-04 DIAGNOSIS — R350 Frequency of micturition: Secondary | ICD-10-CM | POA: Diagnosis not present

## 2019-02-10 ENCOUNTER — Encounter: Payer: Self-pay | Admitting: Obstetrics & Gynecology

## 2019-02-10 ENCOUNTER — Other Ambulatory Visit: Payer: Self-pay

## 2019-02-10 ENCOUNTER — Ambulatory Visit (INDEPENDENT_AMBULATORY_CARE_PROVIDER_SITE_OTHER): Payer: Medicare HMO | Admitting: Obstetrics & Gynecology

## 2019-02-10 ENCOUNTER — Other Ambulatory Visit (HOSPITAL_COMMUNITY)
Admission: RE | Admit: 2019-02-10 | Discharge: 2019-02-10 | Disposition: A | Discharge status: Home / Self Care | Payer: Medicare HMO | Source: Ambulatory Visit | Attending: Obstetrics & Gynecology | Admitting: Obstetrics & Gynecology

## 2019-02-10 VITALS — BP 122/68 | HR 76 | Temp 97.6°F | Resp 10 | Ht 62.25 in | Wt 107.6 lb

## 2019-02-10 DIAGNOSIS — Z01419 Encounter for gynecological examination (general) (routine) without abnormal findings: Secondary | ICD-10-CM | POA: Diagnosis not present

## 2019-02-10 DIAGNOSIS — E2839 Other primary ovarian failure: Secondary | ICD-10-CM | POA: Diagnosis not present

## 2019-02-10 DIAGNOSIS — Z124 Encounter for screening for malignant neoplasm of cervix: Secondary | ICD-10-CM

## 2019-02-10 MED ORDER — ESTRADIOL 0.1 MG/GM VA CREA: TOPICAL_CREAM | VAGINAL | 1 refills | Status: AC

## 2019-02-10 NOTE — Progress Notes (Signed)
69 y.o. G2P1 Single White or Caucasian female here for annual exam.  Doing well.  Denies vaginal bleeding.  She had two UTIs in the past few months.  Does feel like she empties her bladder completely.  She is not sure if a urine culture was done.    PCP:  Dr. Dagmar Hait.    Patient's last menstrual period was 01/02/1999.          Sexually active: Yes.    The current method of family planning is post menopausal status.    Exercising: Yes.    Walking Smoker:  no  Health Maintenance: Pap:  06/23/15 Neg              03/27/13 Neg  History of abnormal Pap:  yes MMG:  08/14/18 BIRADS 1 negative/b Colonoscopy:  Cologuard was done with Dr. Dagmar Hait BMD:   04/10/13 Osteopenia TDaP:  Current  Pneumonia vaccine(s):  D/w pt today.  She is going to have this done eventually Shingrix:   no Hep C testing: 06/23/15 Neg  Screening Labs: PCP   reports that she has never smoked. She has never used smokeless tobacco. She reports current alcohol use of about 2.0 standard drinks of alcohol per week. She reports that she does not use drugs.  Past Medical History:  Diagnosis Date  . Abnormal Pap smear of cervix   . Anxiety   . Cataract, immature   . Chest pain   . 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 Dental crowns present   . DOE (dyspnea on exertion)   . GERD (gastroesophageal reflux disease)   . Hyperkeratosis of cervix   . Hyperlipidemia   . Labial cyst    1 1/2 cm cyst on right sup labia maj  . Migraine headache   . Migraines   . Osteopenia   . Wears glasses     Past Surgical History:  Procedure Laterality Date  . CARPOMETACARPEL SUSPENSION PLASTY Right 12/23/2012   Procedure: RIGHT HAND TRAPEZIUM EXCISION WITH RIGHT THUMB CARPOMETACARPEL ([place]) SUSPENSION PLASTY;  Surgeon: Cammie Sickle., MD;  Location: [facility];  Service: Orthopedics;  Laterality: Right;  . DORSAL COMPARTMENT RELEASE Left 09/11/2012   Procedure: TRAPEZIUM  EXCISION FOR TRANSFER FIRST DORSAL RELEASE;  Surgeon: Cammie Sickle., MD;  Location: [facility];  Service: Orthopedics;  Laterality: Left;  . NASAL FRACTURE SURGERY    . OTOPLASTY     as a child  . TONSILLECTOMY     as a child    Current Outpatient Medications  Medication Sig Dispense Refill  . ALPRAZolam (XANAX) 0.5 MG tablet   2  . aspirin 81 MG tablet Take 81 mg by mouth daily.    Marland Kitchen buPROPion (WELLBUTRIN) 75 MG tablet     . clobetasol ointment (TEMOVATE) 0.05 % APPLY TOPICALLY 2 TIMES DAILY FOR UP TO 5 DAYS 30 g 0  . escitalopram (LEXAPRO) 20 MG tablet Take 20 mg by mouth daily.  1  . omeprazole (PRILOSEC) 40 MG capsule Take 40 mg by mouth 2 (two) times daily.    . rosuvastatin (CRESTOR) 10 MG tablet Take 10 mg by mouth daily.    . temazepam (RESTORIL) 30 MG capsule      No current facility-administered medications for this visit.    Family History  Problem Relation Age of Onset  . Osteoporosis Mother   . Stroke Mother   . Hypertension Mother   . Heart  attack Mother   . Stroke Maternal Grandmother   . Hypertension Maternal Grandmother   . Leukemia Father     Review of Systems  All other systems reviewed and are negative.   Exam:   BP 122/68 (BP Location: Right Arm, Patient Position: Sitting, Cuff Size: Normal)   Pulse 76   Temp 97.6 F (36.4 C) (Temporal)   Resp 10   Ht 5' 2.25" (1.581 m)   Wt 107 lb 9.6 oz (48.8 kg)   LMP 01/02/1999   BMI 19.52 kg/m    Height: 5' 2.25" (158.1 cm)  Ht Readings from Last 3 Encounters:  02/10/19 5' 2.25" (1.581 m)  01/16/18 5' 2.2" (1.58 m)  10/01/17 5' 2.5" (1.588 m)    General appearance: alert, cooperative and appears stated age Head: Normocephalic, without obvious abnormality, atraumatic Neck: no adenopathy, supple, symmetrical, trachea midline and thyroid normal to inspection and palpation Lungs: clear to auscultation bilaterally Breasts: normal appearance, no masses or tenderness Heart: regular  rate and rhythm Abdomen: soft, non-tender; bowel sounds normal; no masses,  no organomegaly Extremities: extremities normal, atraumatic, no cyanosis or edema Skin: Skin color, texture, turgor normal. No rashes or lesions Lymph nodes: Cervical, supraclavicular, and axillary nodes normal. No abnormal inguinal nodes palpated Neurologic: Grossly normal   Pelvic: External genitalia:  no lesions              Urethra:  normal appearing urethra with no masses, tenderness or lesions              Bartholins and Skenes: normal                 Vagina: normal appearing vagina with normal color and discharge, no lesions              Cervix: no lesions              Pap taken: Yes.   Bimanual Exam:  Uterus:  normal size, contour, position, consistency, mobility, non-tender              Adnexa: normal adnexa and no mass, fullness, tenderness               Rectovaginal: Confirms               Anus:  normal sphincter tone, no lesions  Chaperone, Terence Lux, CMA, was present for exam.  A:  Well Woman with normal exam PMP, no HRT Vaginal atrophy H/o lichen sclerosus Migraines H/o elevated lipids  P:   Mammogram guidelines reviewed pap smear obtained today Release of records for cologuard and urine tests/cultures from the last six month signed today Estradiol cream topically two to three times weekly.  rx to pharmacy sent today. BMD order placed Does not need clobetasol rx today. Declines shingrix vaccination and pneumonia vaccination.  Not really interested in Covid vaccination as well. return annually or prn

## 2019-02-10 NOTE — Patient Instructions (Signed)
Plan to do your bone density with your mammogram in the summer.   Covid vaccination:  [phone]

## 2019-02-11 DIAGNOSIS — R350 Frequency of micturition: Secondary | ICD-10-CM | POA: Diagnosis not present

## 2019-02-11 DIAGNOSIS — R300 Dysuria: Secondary | ICD-10-CM | POA: Diagnosis not present

## 2019-02-11 DIAGNOSIS — N390 Urinary tract infection, site not specified: Secondary | ICD-10-CM | POA: Diagnosis not present

## 2019-02-11 LAB — CYTOLOGY - PAP: Diagnosis: NEGATIVE

## 2019-02-12 ENCOUNTER — Other Ambulatory Visit: Payer: Self-pay | Admitting: Obstetrics & Gynecology

## 2019-02-12 DIAGNOSIS — Z1231 Encounter for screening mammogram for malignant neoplasm of breast: Secondary | ICD-10-CM

## 2019-03-02 DIAGNOSIS — R69 Illness, unspecified: Secondary | ICD-10-CM | POA: Diagnosis not present

## 2019-03-16 DIAGNOSIS — R350 Frequency of micturition: Secondary | ICD-10-CM | POA: Diagnosis not present

## 2019-03-16 DIAGNOSIS — N390 Urinary tract infection, site not specified: Secondary | ICD-10-CM | POA: Diagnosis not present

## 2019-04-17 DIAGNOSIS — N3020 Other chronic cystitis without hematuria: Secondary | ICD-10-CM | POA: Diagnosis not present

## 2019-04-17 DIAGNOSIS — R3915 Urgency of urination: Secondary | ICD-10-CM | POA: Diagnosis not present

## 2019-04-21 DIAGNOSIS — D649 Anemia, unspecified: Secondary | ICD-10-CM | POA: Diagnosis not present

## 2019-04-21 DIAGNOSIS — R002 Palpitations: Secondary | ICD-10-CM | POA: Diagnosis not present

## 2019-04-21 DIAGNOSIS — R5383 Other fatigue: Secondary | ICD-10-CM | POA: Diagnosis not present

## 2019-04-21 DIAGNOSIS — E559 Vitamin D deficiency, unspecified: Secondary | ICD-10-CM | POA: Diagnosis not present

## 2019-04-21 DIAGNOSIS — G4700 Insomnia, unspecified: Secondary | ICD-10-CM | POA: Diagnosis not present

## 2019-08-17 ENCOUNTER — Ambulatory Visit: Payer: Medicare HMO

## 2019-08-17 ENCOUNTER — Other Ambulatory Visit: Payer: Medicare HMO

## 2019-08-18 DIAGNOSIS — H5213 Myopia, bilateral: Secondary | ICD-10-CM | POA: Diagnosis not present

## 2019-08-18 DIAGNOSIS — Z961 Presence of intraocular lens: Secondary | ICD-10-CM | POA: Diagnosis not present

## 2019-08-20 ENCOUNTER — Ambulatory Visit
Admission: RE | Admit: 2019-08-20 | Discharge: 2019-08-20 | Disposition: A | Discharge status: Home / Self Care | Payer: Medicare HMO | Source: Ambulatory Visit | Attending: Obstetrics & Gynecology | Admitting: Obstetrics & Gynecology

## 2019-08-20 ENCOUNTER — Other Ambulatory Visit: Payer: Self-pay

## 2019-08-20 DIAGNOSIS — Z780 Asymptomatic menopausal state: Secondary | ICD-10-CM | POA: Diagnosis not present

## 2019-08-20 DIAGNOSIS — M8589 Other specified disorders of bone density and structure, multiple sites: Secondary | ICD-10-CM | POA: Diagnosis not present

## 2019-08-20 DIAGNOSIS — E785 Hyperlipidemia, unspecified: Secondary | ICD-10-CM | POA: Diagnosis not present

## 2019-08-20 DIAGNOSIS — E2839 Other primary ovarian failure: Secondary | ICD-10-CM

## 2019-08-20 DIAGNOSIS — D649 Anemia, unspecified: Secondary | ICD-10-CM | POA: Diagnosis not present

## 2019-08-25 ENCOUNTER — Telehealth: Payer: Self-pay

## 2019-08-25 NOTE — Telephone Encounter (Signed)
Left message for call back.

## 2019-08-25 NOTE — Telephone Encounter (Signed)
Patient notified of results as written by provider 

## 2019-08-25 NOTE — Telephone Encounter (Signed)
Patient is returning call.  °

## 2019-08-25 NOTE — Telephone Encounter (Signed)
-----   Message from Megan Salon, MD sent at 08/25/2019  1:03 AM EDT ----- Please notify pt her BMD shows osteopenia only.  There is no osteoporosis.  Ok to continue watching this and repeat again in 3-4 years.  Thanks.

## 2019-08-26 ENCOUNTER — Ambulatory Visit
Admission: RE | Admit: 2019-08-26 | Discharge: 2019-08-26 | Disposition: A | Discharge status: Home / Self Care | Payer: Medicare HMO | Source: Ambulatory Visit | Attending: Obstetrics & Gynecology | Admitting: Obstetrics & Gynecology

## 2019-08-26 ENCOUNTER — Other Ambulatory Visit: Payer: Self-pay

## 2019-08-26 DIAGNOSIS — Z1231 Encounter for screening mammogram for malignant neoplasm of breast: Secondary | ICD-10-CM | POA: Diagnosis not present

## 2019-09-03 DIAGNOSIS — R69 Illness, unspecified: Secondary | ICD-10-CM | POA: Diagnosis not present

## 2019-09-10 ENCOUNTER — Other Ambulatory Visit: Payer: Self-pay

## 2019-09-10 NOTE — Telephone Encounter (Signed)
Patient is calling in regards to refill of Temovate. Patient would like it send to [facility] on [address], [place], [place] [address]. Phone [phone].

## 2019-09-15 NOTE — Telephone Encounter (Signed)
Medication refill request: Temovate Last AEX:  02/10/19 SM Next AEX: 05/10/20 Last MMG (if hormonal medication request): n/a Refill authorized: today, please advise

## 2019-09-15 NOTE — Telephone Encounter (Signed)
Patient checking status of refill request. °

## 2019-09-16 MED ORDER — CLOBETASOL PROPIONATE 0.05 % EX OINT: TOPICAL_OINTMENT | CUTANEOUS | 0 refills | Status: DC

## 2019-09-16 NOTE — Telephone Encounter (Signed)
Tried calling patient to notify that prescription was sent to pharmacy. No answer, per DPR okay to leave a detailed message at mobile number listed. Message left for patient and advised if any questions to give our office a call back. Okay to close encounter.

## 2019-09-23 DIAGNOSIS — R311 Benign essential microscopic hematuria: Secondary | ICD-10-CM | POA: Diagnosis not present

## 2019-09-23 DIAGNOSIS — N3020 Other chronic cystitis without hematuria: Secondary | ICD-10-CM | POA: Diagnosis not present

## 2019-10-13 DIAGNOSIS — E785 Hyperlipidemia, unspecified: Secondary | ICD-10-CM | POA: Diagnosis not present

## 2019-10-13 DIAGNOSIS — D649 Anemia, unspecified: Secondary | ICD-10-CM | POA: Diagnosis not present

## 2019-10-13 DIAGNOSIS — E559 Vitamin D deficiency, unspecified: Secondary | ICD-10-CM | POA: Diagnosis not present

## 2019-10-20 DIAGNOSIS — Z0000 Encounter for general adult medical examination without abnormal findings: Secondary | ICD-10-CM | POA: Diagnosis not present

## 2019-10-20 DIAGNOSIS — R82998 Other abnormal findings in urine: Secondary | ICD-10-CM | POA: Diagnosis not present

## 2019-10-20 DIAGNOSIS — G4700 Insomnia, unspecified: Secondary | ICD-10-CM | POA: Diagnosis not present

## 2019-10-20 DIAGNOSIS — R0989 Other specified symptoms and signs involving the circulatory and respiratory systems: Secondary | ICD-10-CM | POA: Diagnosis not present

## 2019-10-20 DIAGNOSIS — E559 Vitamin D deficiency, unspecified: Secondary | ICD-10-CM | POA: Diagnosis not present

## 2019-10-20 DIAGNOSIS — I7300 Raynaud's syndrome without gangrene: Secondary | ICD-10-CM | POA: Diagnosis not present

## 2019-10-20 DIAGNOSIS — R69 Illness, unspecified: Secondary | ICD-10-CM | POA: Diagnosis not present

## 2019-10-20 DIAGNOSIS — G43909 Migraine, unspecified, not intractable, without status migrainosus: Secondary | ICD-10-CM | POA: Diagnosis not present

## 2019-10-20 DIAGNOSIS — Z23 Encounter for immunization: Secondary | ICD-10-CM | POA: Diagnosis not present

## 2019-10-20 DIAGNOSIS — E785 Hyperlipidemia, unspecified: Secondary | ICD-10-CM | POA: Diagnosis not present

## 2019-10-20 DIAGNOSIS — R350 Frequency of micturition: Secondary | ICD-10-CM | POA: Diagnosis not present

## 2019-10-22 DIAGNOSIS — N3020 Other chronic cystitis without hematuria: Secondary | ICD-10-CM | POA: Diagnosis not present

## 2019-10-29 DIAGNOSIS — R69 Illness, unspecified: Secondary | ICD-10-CM | POA: Diagnosis not present

## 2019-11-03 DIAGNOSIS — N390 Urinary tract infection, site not specified: Secondary | ICD-10-CM | POA: Diagnosis not present

## 2019-11-03 DIAGNOSIS — R300 Dysuria: Secondary | ICD-10-CM | POA: Diagnosis not present

## 2019-11-06 DIAGNOSIS — N3020 Other chronic cystitis without hematuria: Secondary | ICD-10-CM | POA: Diagnosis not present

## 2019-11-06 DIAGNOSIS — R3915 Urgency of urination: Secondary | ICD-10-CM | POA: Diagnosis not present

## 2019-11-06 DIAGNOSIS — R109 Unspecified abdominal pain: Secondary | ICD-10-CM | POA: Diagnosis not present

## 2019-11-16 DIAGNOSIS — R3915 Urgency of urination: Secondary | ICD-10-CM | POA: Diagnosis not present

## 2019-11-17 DIAGNOSIS — R300 Dysuria: Secondary | ICD-10-CM | POA: Diagnosis not present

## 2019-12-08 DIAGNOSIS — R3121 Asymptomatic microscopic hematuria: Secondary | ICD-10-CM | POA: Diagnosis not present

## 2019-12-08 DIAGNOSIS — R3915 Urgency of urination: Secondary | ICD-10-CM | POA: Diagnosis not present

## 2019-12-08 DIAGNOSIS — N3020 Other chronic cystitis without hematuria: Secondary | ICD-10-CM | POA: Diagnosis not present

## 2019-12-10 DIAGNOSIS — N3021 Other chronic cystitis with hematuria: Secondary | ICD-10-CM | POA: Diagnosis not present

## 2019-12-10 DIAGNOSIS — R300 Dysuria: Secondary | ICD-10-CM | POA: Diagnosis not present

## 2019-12-10 DIAGNOSIS — R3121 Asymptomatic microscopic hematuria: Secondary | ICD-10-CM | POA: Diagnosis not present

## 2019-12-15 DIAGNOSIS — N952 Postmenopausal atrophic vaginitis: Secondary | ICD-10-CM | POA: Diagnosis not present

## 2019-12-15 DIAGNOSIS — N3021 Other chronic cystitis with hematuria: Secondary | ICD-10-CM | POA: Diagnosis not present

## 2019-12-21 DIAGNOSIS — N3020 Other chronic cystitis without hematuria: Secondary | ICD-10-CM | POA: Diagnosis not present

## 2019-12-21 DIAGNOSIS — D1771 Benign lipomatous neoplasm of kidney: Secondary | ICD-10-CM | POA: Diagnosis not present

## 2020-02-16 ENCOUNTER — Other Ambulatory Visit: Payer: Self-pay | Admitting: Obstetrics & Gynecology

## 2020-02-22 ENCOUNTER — Other Ambulatory Visit: Payer: Self-pay | Admitting: Obstetrics & Gynecology

## 2020-03-04 ENCOUNTER — Other Ambulatory Visit: Payer: Self-pay | Admitting: Obstetrics & Gynecology

## 2020-03-14 ENCOUNTER — Other Ambulatory Visit (HOSPITAL_BASED_OUTPATIENT_CLINIC_OR_DEPARTMENT_OTHER): Payer: Self-pay | Admitting: *Deleted

## 2020-03-14 MED ORDER — CLOBETASOL PROPIONATE 0.05 % EX OINT: TOPICAL_OINTMENT | CUTANEOUS | 2 refills | Status: DC

## 2020-03-14 NOTE — Telephone Encounter (Signed)
Pt has next annual scheduled. KW CMA

## 2020-04-08 DIAGNOSIS — G4700 Insomnia, unspecified: Secondary | ICD-10-CM | POA: Diagnosis not present

## 2020-04-08 DIAGNOSIS — R69 Illness, unspecified: Secondary | ICD-10-CM | POA: Diagnosis not present

## 2020-04-08 DIAGNOSIS — R5383 Other fatigue: Secondary | ICD-10-CM | POA: Diagnosis not present

## 2020-04-08 DIAGNOSIS — D649 Anemia, unspecified: Secondary | ICD-10-CM | POA: Diagnosis not present

## 2020-05-10 ENCOUNTER — Ambulatory Visit: Payer: Medicare HMO

## 2020-08-23 DIAGNOSIS — H52203 Unspecified astigmatism, bilateral: Secondary | ICD-10-CM | POA: Diagnosis not present

## 2020-08-23 DIAGNOSIS — Z961 Presence of intraocular lens: Secondary | ICD-10-CM | POA: Diagnosis not present

## 2020-10-17 DIAGNOSIS — Z0100 Encounter for examination of eyes and vision without abnormal findings: Secondary | ICD-10-CM | POA: Diagnosis not present

## 2020-10-25 DIAGNOSIS — E785 Hyperlipidemia, unspecified: Secondary | ICD-10-CM | POA: Diagnosis not present

## 2020-10-25 DIAGNOSIS — E559 Vitamin D deficiency, unspecified: Secondary | ICD-10-CM | POA: Diagnosis not present

## 2020-10-27 ENCOUNTER — Other Ambulatory Visit: Payer: Self-pay | Admitting: Obstetrics & Gynecology

## 2020-10-27 DIAGNOSIS — Z1231 Encounter for screening mammogram for malignant neoplasm of breast: Secondary | ICD-10-CM

## 2020-11-01 DIAGNOSIS — R0989 Other specified symptoms and signs involving the circulatory and respiratory systems: Secondary | ICD-10-CM | POA: Diagnosis not present

## 2020-11-01 DIAGNOSIS — R82998 Other abnormal findings in urine: Secondary | ICD-10-CM | POA: Diagnosis not present

## 2020-11-01 DIAGNOSIS — Z0000 Encounter for general adult medical examination without abnormal findings: Secondary | ICD-10-CM | POA: Diagnosis not present

## 2020-11-01 DIAGNOSIS — G43909 Migraine, unspecified, not intractable, without status migrainosus: Secondary | ICD-10-CM | POA: Diagnosis not present

## 2020-11-01 DIAGNOSIS — R69 Illness, unspecified: Secondary | ICD-10-CM | POA: Diagnosis not present

## 2020-11-01 DIAGNOSIS — Z23 Encounter for immunization: Secondary | ICD-10-CM | POA: Diagnosis not present

## 2020-11-01 DIAGNOSIS — E559 Vitamin D deficiency, unspecified: Secondary | ICD-10-CM | POA: Diagnosis not present

## 2020-11-01 DIAGNOSIS — R5383 Other fatigue: Secondary | ICD-10-CM | POA: Diagnosis not present

## 2020-11-01 DIAGNOSIS — R350 Frequency of micturition: Secondary | ICD-10-CM | POA: Diagnosis not present

## 2020-11-01 DIAGNOSIS — K219 Gastro-esophageal reflux disease without esophagitis: Secondary | ICD-10-CM | POA: Diagnosis not present

## 2020-11-01 DIAGNOSIS — I7300 Raynaud's syndrome without gangrene: Secondary | ICD-10-CM | POA: Diagnosis not present

## 2020-11-01 DIAGNOSIS — E785 Hyperlipidemia, unspecified: Secondary | ICD-10-CM | POA: Diagnosis not present

## 2020-11-09 DIAGNOSIS — L219 Seborrheic dermatitis, unspecified: Secondary | ICD-10-CM | POA: Diagnosis not present

## 2020-12-01 DIAGNOSIS — N952 Postmenopausal atrophic vaginitis: Secondary | ICD-10-CM | POA: Diagnosis not present

## 2020-12-01 DIAGNOSIS — N3020 Other chronic cystitis without hematuria: Secondary | ICD-10-CM | POA: Diagnosis not present

## 2020-12-02 ENCOUNTER — Other Ambulatory Visit: Payer: Self-pay

## 2020-12-02 ENCOUNTER — Ambulatory Visit
Admission: RE | Admit: 2020-12-02 | Discharge: 2020-12-02 | Disposition: A | Discharge status: Home / Self Care | Payer: Medicare HMO | Source: Ambulatory Visit | Attending: Obstetrics & Gynecology | Admitting: Obstetrics & Gynecology

## 2020-12-02 DIAGNOSIS — Z1231 Encounter for screening mammogram for malignant neoplasm of breast: Secondary | ICD-10-CM | POA: Diagnosis not present

## 2020-12-19 ENCOUNTER — Ambulatory Visit (INDEPENDENT_AMBULATORY_CARE_PROVIDER_SITE_OTHER): Payer: 59 | Admitting: Obstetrics & Gynecology

## 2020-12-19 ENCOUNTER — Other Ambulatory Visit: Payer: Self-pay

## 2020-12-19 ENCOUNTER — Encounter (HOSPITAL_BASED_OUTPATIENT_CLINIC_OR_DEPARTMENT_OTHER): Payer: Self-pay | Admitting: Obstetrics & Gynecology

## 2020-12-19 VITALS — BP 148/83 | HR 72 | Ht 62.5 in | Wt 108.6 lb

## 2020-12-19 DIAGNOSIS — Z780 Asymptomatic menopausal state: Secondary | ICD-10-CM | POA: Diagnosis not present

## 2020-12-19 DIAGNOSIS — M8580 Other specified disorders of bone density and structure, unspecified site: Secondary | ICD-10-CM

## 2020-12-19 DIAGNOSIS — Z01419 Encounter for gynecological examination (general) (routine) without abnormal findings: Secondary | ICD-10-CM | POA: Diagnosis not present

## 2020-12-19 DIAGNOSIS — L900 Lichen sclerosus et atrophicus: Secondary | ICD-10-CM | POA: Diagnosis not present

## 2020-12-19 DIAGNOSIS — Z23 Encounter for immunization: Secondary | ICD-10-CM

## 2020-12-19 NOTE — Progress Notes (Signed)
70 y.o. G2P1 Single White or Caucasian female here for breast and pelvic exam.  Denies vaginal bleeding.  Had issues with recurrent UTIs.  Has been seeing urology.  Recently off antibiotics.  She is taking a probiotic.  Has not had another UTI.    I am following her for osteopenia as well.  Last BMD was 2021.  Patient's last menstrual period was 01/02/1999.          Sexually active: Yes.    H/O STD:  no  Health Maintenance: PCP:  PCP.  Last wellness appt was 10/2020.  Did blood work at that appt:  yes Vaccines are up to date:  tdap is due Colonoscopy:  cologuard 09/2018 MMG:  12/02/2020 Negative BMD:  08/20/2019 Osteopenia Last pap smear:  02/10/2019 Negative.   H/o abnormal pap smear:  remote hx   reports that she has never smoked. She has never used smokeless tobacco. She reports current alcohol use of about 2.0 standard drinks per week. She reports that she does not use drugs.  Past Medical History:  Diagnosis Date   Abnormal Pap smear of cervix    Anxiety    Cataract, immature    Chest pain    [facility] DJD(carpometacarpal degenerative joint disease), localized primary 09/2012   left thumb   Dental crowns present    DOE (dyspnea on exertion)    GERD (gastroesophageal reflux disease)    Hyperkeratosis of cervix    Hyperlipidemia    Labial cyst    1 1/2 cm cyst on right sup labia maj   Migraine headache    Migraines    Osteopenia    Wears glasses     Past Surgical History:  Procedure Laterality Date   CARPOMETACARPEL SUSPENSION PLASTY Right 12/23/2012   Procedure: RIGHT HAND TRAPEZIUM EXCISION WITH RIGHT THUMB CARPOMETACARPEL ([place]) SUSPENSION PLASTY;  Surgeon: Cammie Sickle., MD;  Location: [place];  Service: Orthopedics;  Laterality: Right;   DORSAL COMPARTMENT RELEASE Left 09/11/2012   Procedure: TRAPEZIUM EXCISION FOR TRANSFER FIRST DORSAL RELEASE;  Surgeon: Cammie Sickle., MD;  Location: [facility];  Service: Orthopedics;   Laterality: Left;   NASAL FRACTURE SURGERY     OTOPLASTY     as a child   TONSILLECTOMY     as a child    Current Outpatient Medications  Medication Sig Dispense Refill   ALPRAZolam (XANAX) 0.5 MG tablet   2   buPROPion (WELLBUTRIN) 75 MG tablet      clobetasol ointment (TEMOVATE) 0.05 % Apply topically 2 times daily up for up 5 days 30 g 2   escitalopram (LEXAPRO) 20 MG tablet Take 20 mg by mouth daily.  1   estradiol (ESTRACE) 0.1 MG/GM vaginal cream 1 apply small amount topically two to three times weekly. 42.5 g 1   rosuvastatin (CRESTOR) 10 MG tablet Take 10 mg by mouth daily.     temazepam (RESTORIL) 30 MG capsule      aspirin 81 MG tablet Take 81 mg by mouth daily. (Patient not taking: Reported on 12/19/2020)     omeprazole (PRILOSEC) 40 MG capsule Take 40 mg by mouth 2 (two) times daily. (Patient not taking: Reported on 12/19/2020)     No current facility-administered medications for this visit.    Family History  Problem Relation Age of Onset   Osteoporosis Mother    Stroke Mother    Hypertension Mother    Heart attack Mother    Stroke Maternal  Grandmother    Hypertension Maternal Grandmother    Leukemia Father     Review of Systems  All other systems reviewed and are negative.  Exam:   BP (!) 148/83 (BP Location: Left Arm, Patient Position: Sitting, Cuff Size: Normal)    Pulse 72    Ht 5' 2.5" (1.588 m)    Wt 108 lb 9.6 oz (49.3 kg)    LMP 01/02/1999    BMI 19.55 kg/m   Height: 5' 2.5" (158.8 cm)  General appearance: alert, cooperative and appears stated age Breasts: normal appearance, no masses or tenderness Abdomen: soft, non-tender; bowel sounds normal; no masses,  no organomegaly Lymph nodes: Cervical, supraclavicular, and axillary nodes normal.  No abnormal inguinal nodes palpated Neurologic: Grossly normal  Pelvic: External genitalia:  no lesions              Urethra:  normal appearing urethra with no masses, tenderness or lesions               Bartholins and Skenes: normal                 Vagina: normal appearing vagina with atrophic changes and no discharge, no lesions              Cervix: no lesions              Pap taken: No. Bimanual Exam:  Uterus:  normal size, contour, position, consistency, mobility, non-tender              Adnexa: normal adnexa and no mass, fullness, tenderness               Rectovaginal: Confirms               Anus:  normal sphincter tone, no lesions  Chaperone, Octaviano Batty, CMA, was present for exam.  Assessment/Plan: 1. Encntr for gyn exam (general) (routine) w/o abn findings - pap neg 2021 - MMG 12/05/2020 - cologuard 09/2018.  Plan to repeat next year.   - lab work done 10/2020 with Dr. Dagmar Hait - tdap ordered and given today tdoay  2. Lichen sclerosus - normal exam today.  Does not need RF for topical steroid ointment.  3. Postmenopausal - no HRT  4. Osteopenia, unspecified location - plan BMD in 1-2 years - not on calcium supplements  5.  Recurrent UTIs - pt knows to call if she has any new symptoms - has used topical estradiol cream but not currently.  Does not want RF but wants to keep on med list in case

## 2020-12-29 DIAGNOSIS — N3020 Other chronic cystitis without hematuria: Secondary | ICD-10-CM | POA: Diagnosis not present

## 2021-01-26 DIAGNOSIS — M79674 Pain in right toe(s): Secondary | ICD-10-CM | POA: Diagnosis not present

## 2021-01-26 DIAGNOSIS — L84 Corns and callosities: Secondary | ICD-10-CM | POA: Diagnosis not present

## 2021-02-01 ENCOUNTER — Other Ambulatory Visit: Payer: Self-pay

## 2021-02-01 ENCOUNTER — Ambulatory Visit (INDEPENDENT_AMBULATORY_CARE_PROVIDER_SITE_OTHER): Payer: Medicare HMO

## 2021-02-01 ENCOUNTER — Encounter: Payer: Self-pay | Admitting: Podiatry

## 2021-02-01 ENCOUNTER — Ambulatory Visit: Payer: Medicare HMO | Admitting: Podiatry

## 2021-02-01 DIAGNOSIS — I7300 Raynaud's syndrome without gangrene: Secondary | ICD-10-CM

## 2021-02-01 DIAGNOSIS — M79674 Pain in right toe(s): Secondary | ICD-10-CM

## 2021-02-01 NOTE — Progress Notes (Signed)
Subjective:   Patient ID: Rebecca Turner, female   DOB: 71 y.o.   MRN: [id number]   HPI Patient presents with discoloration of the right big toe with soreness and states there has been color changes and she is not sure about this.  Does not give history of significant injury to the area and does not remember specifics of this but its been present for around 2 months and patient does not smoke and likes to be active   Review of Systems  All other systems reviewed and are negative.      Objective:  Physical Exam Vitals and nursing note reviewed.  Constitutional:      Appearance: She is well-developed.  Pulmonary:     Effort: Pulmonary effort is normal.  Musculoskeletal:        General: Normal range of motion.  Skin:    General: Skin is warm.  Neurological:     Mental Status: She is alert.    Neurovascular status intact muscle strength was found to be adequate with patient having discoloration hallux right with a area of redness underneath the toe and also several spots on adjacent digits.  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  Patient at this point will be seen back as needed and we will gradually warmed the toes up and also she will use warm or tight socks and I discussed the importance of avoiding cold exposure  X-rays were negative for bony changes occur with this condition

## 2021-02-03 ENCOUNTER — Other Ambulatory Visit: Payer: Self-pay | Admitting: Podiatry

## 2021-02-03 DIAGNOSIS — I7300 Raynaud's syndrome without gangrene: Secondary | ICD-10-CM

## 2021-02-23 ENCOUNTER — Other Ambulatory Visit (HOSPITAL_COMMUNITY): Payer: Self-pay

## 2021-04-03 DIAGNOSIS — H6981 Other specified disorders of Eustachian tube, right ear: Secondary | ICD-10-CM | POA: Diagnosis not present

## 2021-04-03 DIAGNOSIS — H9191 Unspecified hearing loss, right ear: Secondary | ICD-10-CM | POA: Diagnosis not present

## 2021-04-13 ENCOUNTER — Institutional Professional Consult (permissible substitution): Payer: Medicare HMO | Admitting: Neurology

## 2021-06-05 DIAGNOSIS — R8271 Bacteriuria: Secondary | ICD-10-CM | POA: Diagnosis not present

## 2021-06-05 DIAGNOSIS — N3020 Other chronic cystitis without hematuria: Secondary | ICD-10-CM | POA: Diagnosis not present

## 2021-07-13 ENCOUNTER — Ambulatory Visit (INDEPENDENT_AMBULATORY_CARE_PROVIDER_SITE_OTHER): Payer: Medicare HMO | Admitting: Neurology

## 2021-07-13 ENCOUNTER — Encounter: Payer: Self-pay | Admitting: Neurology

## 2021-07-13 VITALS — BP 128/79 | HR 76 | Ht 62.5 in | Wt 109.0 lb

## 2021-07-13 DIAGNOSIS — F5104 Psychophysiologic insomnia: Secondary | ICD-10-CM | POA: Diagnosis not present

## 2021-07-13 DIAGNOSIS — R69 Illness, unspecified: Secondary | ICD-10-CM | POA: Diagnosis not present

## 2021-07-13 DIAGNOSIS — R002 Palpitations: Secondary | ICD-10-CM

## 2021-07-13 DIAGNOSIS — R519 Headache, unspecified: Secondary | ICD-10-CM | POA: Diagnosis not present

## 2021-07-13 DIAGNOSIS — F1320 Sedative, hypnotic or anxiolytic dependence, uncomplicated: Secondary | ICD-10-CM | POA: Insufficient documentation

## 2021-07-13 NOTE — Progress Notes (Signed)
SLEEP MEDICINE CLINIC    Provider:  Larey Seat, MD  Primary Care Physician:  Prince Solian, [place] [address]     Referring Provider: Prince Solian, [address] [place],  [place] [address]          Chief Complaint according to patient   Patient presents with:     New Patient (Initial Visit)           HISTORY OF PRESENT ILLNESS:  Rebecca Turner is a 71 y.o. year old White or Caucasian female patient seen here as a referral on 07/13/2021 from [place].  Chief concern according to patient :  " I have had insomnia all my life but this is controlled on Temazepam, prescribed by Dr Dagmar Hait. No snoring, no sleep apnea no RLS and no palpitations in the night, no diaphoresis. I used to have menopausal symptoms, night sweats - but not for years now." Migraines were present 25 years ago, but these are no longer the same HA, no nausea, visual aura, no photophobia-Headaches don't wake me form sleep, they are just present in AM, and I treat them with caffeine."    Derry Skill  has a past medical history of Abnormal Pap smear of cervix, Anxiety, Cataract, immature, Chest pain, [facility] DJD(carpometacarpal degenerative joint disease), localized primary (09/01/2012), Dental crowns present, DOE (dyspnea on exertion), GERD (gastroesophageal reflux disease), Hyperkeratosis of cervix, Hyperlipidemia, Labial cyst, Migraine headache, Migraines, Osteopenia, Raynaud's disease, and Wears glasses.   The patient had no previous sleep study.    Sleep relevant medical history: no Nocturia, no Sleep walking/ talking. Except once on Ambien.  Tonsillectomy in childhood , cervical spine surgery/deviated nasal septum repair at age 43, septoplasty.  Family medical /sleep history: Father had OSA , died at 5 of Lymphoma, Mother with insomnia- she passed at age 16 with dementia, vascular    Social history: Lost her partner this year-  Patient is a Psychologist, occupational at  teaching citizenship classes. retired from [employer] office, she  lives in a household alone  with 2 rescue dogs.  One adult son.    Tobacco use; None.  ETOH use 0 seldomly 2 a year? , Caffeine intake in form of Coffee( every AM .) Soda( /) Tea ( social ) not using  energy drinks. Regular exercise in form of : walking. Hobbies : dogs, volunteer.    Sleep habits are as follows: The patient's dinner time is variable, she doesn't like to cook for herself.   The patient goes to bed at 11-12 PM and continues to sleep for 4 hours, wakes up and feeds her dogs, and goes back to bed- 3 hours more of sleep The preferred sleep position is sideways, with the support of 1 pillows.  Dreams are reportedly infrequent/vivid.  7-8  AM is the usual rise time. The patient wakes up spontaneously. She reports not feeling refreshed or restored in AM, with symptoms such as dry mouth, morning headaches, and residual fatigue. Naps are taken infrequently, lasting from 15 to 20 minutes ( longer naps are interfering  with nocturnal sleep).    Review of Systems: Out of a complete 14 system review, the patient complains of only the following symptoms, and all other reviewed systems are negative.:  Fatigue, sleepiness , snoring, fragmented sleep, Insomnia   Temazepam helps to go to sleep- after a hot bath,    How likely are you to doze in the following situations: 0 = not likely,  1 = slight chance, 2 = moderate chance, 3 = high chance   Sitting and Reading? Watching Television? Sitting inactive in a public place (theater or meeting)? As a passenger in a car for an hour without a break? Lying down in the afternoon when circumstances permit? Sitting and talking to someone? Sitting quietly after lunch without alcohol? In a car, while stopped for a few minutes in traffic?   Total = 6/ 24 points   FSS endorsed at 19/ 63 points.  GDS 0/ 15  Social History   Socioeconomic History   Marital status: Single    Spouse  name: Not on file   Number of children: 1   Years of education: Not on file   Highest education level: Bachelor's degree (e.g., BA, AB, BS)  Occupational History   Not on file  Tobacco Use   Smoking status: Never   Smokeless tobacco: Never  Vaping Use   Vaping Use: Never used  Substance and Sexual Activity   Alcohol use: Yes    Alcohol/week: 2.0 standard drinks of alcohol    Types: 2 Standard drinks or equivalent per week    Comment: rarely   Drug use: No   Sexual activity: Yes    Partners: Male    Birth control/protection: Post-menopausal  Other Topics Concern   Not on file  Social History Narrative   Lives at home w her dogs   R handed   Caffeine: 2 C of coffee a day   Social Determinants of Radio broadcast assistant Strain: Not on file  Food Insecurity: Not on file  Transportation Needs: Not on file  Physical Activity: Not on file  Stress: Not on file  Social Connections: Not on file    Family History  Problem Relation Age of Onset   Osteoporosis Mother    Stroke Mother    Hypertension Mother    Heart attack Mother    Stroke Maternal Grandmother    Hypertension Maternal Grandmother    Leukemia Father     Past Medical History:  Diagnosis Date   Abnormal Pap smear of cervix    Anxiety    Cataract, immature    Chest pain    [facility] DJD(carpometacarpal degenerative joint disease), localized primary 09/01/2012   left thumb   Dental crowns present    DOE (dyspnea on exertion)    GERD (gastroesophageal reflux disease)    Hyperkeratosis of cervix    Hyperlipidemia    Labial cyst    1 1/2 cm cyst on right sup labia maj   Migraine headache    Migraines    Osteopenia    Raynaud's disease    Wears glasses     Past Surgical History:  Procedure Laterality Date   CARPOMETACARPEL SUSPENSION PLASTY Right 12/23/2012   Procedure: RIGHT HAND TRAPEZIUM EXCISION WITH RIGHT THUMB CARPOMETACARPEL ([place]) SUSPENSION PLASTY;  Surgeon: Cammie Sickle., MD;  Location:  [facility];  Service: Orthopedics;  Laterality: Right;   DORSAL COMPARTMENT RELEASE Left 09/11/2012   Procedure: TRAPEZIUM EXCISION FOR TRANSFER FIRST DORSAL RELEASE;  Surgeon: Cammie Sickle., MD;  Location: [facility];  Service: Orthopedics;  Laterality: Left;   NASAL FRACTURE SURGERY     OTOPLASTY     as a child   TONSILLECTOMY     as a child     Current Outpatient Medications on File Prior to Visit  Medication Sig Dispense Refill   ALPRAZolam (XANAX) 0.5 MG tablet  2   Cholecalciferol (VITAMIN D3) 50 MCG (2000 UT) TABS Take 1 tablet by mouth daily.     clobetasol ointment (TEMOVATE) 0.05 % Apply topically 2 times daily up for up 5 days 30 g 2   Cranberry-Vitamin C-Probiotic (AZO CRANBERRY PO) Take 1 tablet by mouth 2 (two) times daily.     escitalopram (LEXAPRO) 20 MG tablet Take 20 mg by mouth daily.  1   estradiol (ESTRACE) 0.1 MG/GM vaginal cream 1 apply small amount topically two to three times weekly. 42.5 g 1   Lactobacillus-Inulin (CULTURELLE DIGESTIVE DAILY) CAPS Take by mouth daily.     rosuvastatin (CRESTOR) 10 MG tablet Take 10 mg by mouth daily.     temazepam (RESTORIL) 30 MG capsule      No current facility-administered medications on file prior to visit.    Allergies  Allergen Reactions   Serzone [Nefazodone] Itching    Physical exam:  Today's Vitals   07/13/21 1251  BP: 128/79  Pulse: 76  Weight: 109 lb (49.4 kg)  Height: 5' 2.5" (1.588 m)   Body mass index is 19.62 kg/m.   Wt Readings from Last 3 Encounters:  07/13/21 109 lb (49.4 kg)  12/19/20 108 lb 9.6 oz (49.3 kg)  02/10/19 107 lb 9.6 oz (48.8 kg)     Ht Readings from Last 3 Encounters:  07/13/21 5' 2.5" (1.588 m)  12/19/20 5' 2.5" (1.588 m)  02/10/19 5' 2.25" (1.581 m)      General: The patient is awake, alert and appears not in acute distress. The patient is well groomed. Head: Normocephalic, atraumatic. Neck is supple. Mallampati: 3,  neck  circumference:13 inches . Nasal airflow patent.  Retrognathia is not seen.  Dental status: small oral opening, braces and retainers in teenage.  Cardiovascular:  Regular rate and cardiac rhythm by pulse,  without distended neck veins. Respiratory: Lungs are clear to auscultation.  Skin:  Without evidence of ankle edema, or rash. Trunk: The patient's posture is erect.   Neurologic exam : The patient is awake and alert, oriented to place and time.   Memory subjective described as intact.  Attention span & concentration ability appears normal.  Speech is fluent, without  dysarthria, dysphonia or aphasia.  Mood and affect are appropriate.   Cranial nerves: no loss of smell or taste reported  Pupils are equal and briskly reactive to light.  Funduscopic exam- status ost lens implant- no edema , no pulsatory abnormalities.  Extraocular movements in vertical and horizontal planes were intact and without nystagmus. No Diplopia. Visual fields by finger perimetry are intact. Hearing was intact to soft voice and finger rubbing.    Facial sensation intact to fine touch.  Facial motor strength is symmetric and tongue and uvula move midline.  Neck ROM : rotation, tilt and flexion extension were normal for age and shoulder shrug was symmetrical.    Motor exam:  Symmetric bulk, tone and ROM.   Normal tone without cog wheeling, symmetric grip strength .   Sensory:  Fine touch, pinprick and vibration were tested  and  normal.  Proprioception tested in the upper extremities was normal.   Coordination: Rapid alternating movements in the fingers/hands were of normal speed.  The Finger-to-nose maneuver was intact without evidence of ataxia, dysmetria or tremor.   Gait and station: Patient could rise unassisted from a seated position, walked without assistive device.  Stance is of normal width/ base and the patient turned with 3 steps.  Toe and heel walk  were deferred.  Deep tendon reflexes: in the upper  and lower extremities are symmetric and intact.  Babinski response was deferred.     Labs : Laboratory tests that were provided for me are from October 2022, quoting the patient's BMI at 19.7, her complete metabolic panel was within normal range except for a slightly lower protein 5.9 albumin however was in normal range.  Normal kidney function normal liver function tests BUN 14, CBC with differential showed a normal white blood cell test red blood cell count normal hemoglobin and hematocrit normal MCV and MCH.  Lipid panel shows high HDL which is good, so she has a low LDL to HDL ratio which is excellent.  TSH 2.73 normal range vitamin D level definitely low under 30.  I would further suggest to supplement.  She has a history of Raynaud's syndrome without gangrene she has a history of gastroesophageal reflux disease in the past but no esophagitis, she has all the Vaccinations updated and currently is up to date.  She [facility]am [facility],      After spending a total time of  40  minutes face to face and additional time for physical and neurologic examination, review of laboratory studies,  personal review of imaging studies, reports and results of other testing and review of referral information / records as far as provided in visit, I have established the following assessments:  1)  chronic insomnia, which has been treated with sleep aids, currently on temazepam 30 mg , this is medication controlled.  2)  morning headaches respond to caffeine, and are not cause for sleep interruption. Headache seem to be related to sleep position, degree of neck elevation.  3) GERD has resolved with retirement.  3) overall no clinical concern of OSA by this patient. -   My Plan is to proceed with:  1) I can offer a sleep study, this has to be an attended sleep study as we are not screening for OSA, but possibly cardiac arrhythmia, hypoxia as causes for morning  headaches.   2) Insomnia sleep study - means to rule out organic sleep disorder will need a full PSG.  3) chronic Insomnia best treated with cognitive behaviour therapy.   I would like to thank Prince Solian, MD and Prince Solian, [place],  [place] [address] for allowing me to meet with and to take care of this pleasant patient.    follow up through our NP within 2-3 months.   CC: I will share my notes with PCP.  Electronically signed by: Larey Seat, MD 07/13/2021 1:00 PM  [facility] and [place] certified by The [employer] of Sleep Medicine and Diplomate of the [employer] of Sleep Medicine. Board certified In Neurology through the [place], Fellow of the [employer] of Neurology. Medical Director of [employer].

## 2021-07-17 DIAGNOSIS — N3020 Other chronic cystitis without hematuria: Secondary | ICD-10-CM | POA: Diagnosis not present

## 2021-07-26 ENCOUNTER — Telehealth: Payer: Self-pay | Admitting: Neurology

## 2021-07-26 NOTE — Telephone Encounter (Signed)
[employer]etna medicare pending uploaded notes on the portal  

## 2021-07-31 ENCOUNTER — Telehealth: Payer: Self-pay

## 2021-07-31 NOTE — Telephone Encounter (Signed)
Pt will call back to schedule sleep study after she talks with her Art therapist.

## 2021-08-08 NOTE — Telephone Encounter (Signed)
Pt will call back once she talks with her Art therapist.

## 2021-08-16 NOTE — Telephone Encounter (Signed)
Patient called back and left a voicemail.. I called her back but no answer left her a vmail to call back to schedule.    [employer] medicare Josem Kaufmann: [id number] (exp. 07/26/21 to 01/26/22)

## 2021-08-16 NOTE — Telephone Encounter (Signed)
Patient called back.  She is scheduled at [facility] for 09/21/21 at 9 pm.  NPSG- [employer]na medicare Josem Kaufmann: [id number] (exp. 07/26/21 to 126/24)   Mailed packet to the patient.

## 2021-08-16 NOTE — Telephone Encounter (Signed)
Patient called back and left a voicemail.. I called her back but no answer left her a vmail to call back to schedule.    [employer] medicare Josem Kaufmann: [id number] (exp. 07/26/21 to 01/26/22)

## 2021-08-25 ENCOUNTER — Other Ambulatory Visit (HOSPITAL_BASED_OUTPATIENT_CLINIC_OR_DEPARTMENT_OTHER): Payer: Self-pay | Admitting: Obstetrics & Gynecology

## 2021-08-25 DIAGNOSIS — H26492 Other secondary cataract, left eye: Secondary | ICD-10-CM | POA: Diagnosis not present

## 2021-08-25 DIAGNOSIS — H5213 Myopia, bilateral: Secondary | ICD-10-CM | POA: Diagnosis not present

## 2021-08-25 MED ORDER — CLOBETASOL PROPIONATE 0.05 % EX OINT: TOPICAL_OINTMENT | CUTANEOUS | 2 refills | Status: DC

## 2021-09-20 DIAGNOSIS — H26492 Other secondary cataract, left eye: Secondary | ICD-10-CM | POA: Diagnosis not present

## 2021-09-21 ENCOUNTER — Ambulatory Visit (INDEPENDENT_AMBULATORY_CARE_PROVIDER_SITE_OTHER): Payer: Medicare HMO | Admitting: Neurology

## 2021-09-21 DIAGNOSIS — R519 Headache, unspecified: Secondary | ICD-10-CM

## 2021-09-21 DIAGNOSIS — F5104 Psychophysiologic insomnia: Secondary | ICD-10-CM

## 2021-09-21 DIAGNOSIS — G478 Other sleep disorders: Secondary | ICD-10-CM | POA: Diagnosis not present

## 2021-09-21 DIAGNOSIS — R002 Palpitations: Secondary | ICD-10-CM

## 2021-09-21 DIAGNOSIS — F1320 Sedative, hypnotic or anxiolytic dependence, uncomplicated: Secondary | ICD-10-CM

## 2021-09-29 NOTE — Progress Notes (Signed)
IMPRESSION: 1) Sleep disordered breathing was not present.  2) Periodic limb movements were not arousal causing. 3)Sleep efficiency was82%.Sleep fragmentationwas not significant. Total sleep time was reducedat 341.60mnutes. The reviewed arousals were unrelated to physiological factors that are monitored during a PSG study.   RECOMMENDATIONS: There is no organic factor that I can identify as a cause of nonrestorative sleep, nor can I find an organic cause of Insomnia.

## 2021-09-29 NOTE — Procedures (Signed)
POLYSOMNOGRAPHY STUDY - [facility]    STUDY DATE:  09/21/2021     PATIENT NAME:  Rebecca Turner         DATE OF BIRTH:  [birth year]  PATIENT ID:  [id number]    TYPE OF STUDY:  PSG  READING PHYSICIAN: Larey Seat, MD REFERRED BY: Dr. Dagmar Hait SCORING TECHNICIAN: Gaylyn Cheers, RPSGT   HISTORY:  Noraa Pickeral is a 71 - year- old widow, seen in referral on 07/13/2021 from Dr Dagmar Hait.  Chief concern:  " I have had insomnia all my life but this is controlled on Temazepam, prescribed by Dr Dagmar Hait. No snoring, no sleep apnea, no RLS and no palpitations in the night, no diaphoresis. I used to have menopausal symptoms, night sweats - but not for years now." Migraines were present 25 years ago, but these are no longer the same HA, no nausea, visual aura, no photophobia -Headaches don't wake me form sleep, they are just present in AM, and I treat them with caffeine." Derry Skill has a past medical history of Anxiety, Cataract, Chest pain, [facility] DJD (carpometacarpal degenerative joint disease), localized primary (09/01/2012), Dental crowns present, DOE (dyspnea on exertion), GERD (gastroesophageal reflux disease), Hyperlipidemia, Labial cyst, Migraine headache, Migraines, Osteopenia, Raynaud's disease.   Social history: Lost her partner of many years earlier this year- Patient is a Psychologist, occupational. Widowed, retired from [employer] office, she lives in a household alone.The patient goes to bed at 11-12 PM and continues to sleep for 4 hours, wakes up and feeds her dogs, and goes back to bed- 3 hours more of sleep. She reports not feeling refreshed or restored in AM, with symptoms such as dry mouth, morning headaches, and residual fatigue. Naps are taken infrequently, lasting from 15 to 20 minutes ( longer naps are interfering with nocturnal sleep).   ADDITIONAL INFORMATION:  The Epworth Sleepiness Scale was endorsed at 6 /24 points (scores above or equal to 10 are suggestive of  hypersomnolence). FSS endorsed at 19 /63 points. GDS 0/15 points.   Height: 63 in Weight: 109 lb (BMI 19) Neck Size: 13 in  MEDICATIONS: Xanax, Vitamin D3, Temovate, Azo cranberry, Lexapro, Estrace, Culturelle digestive daily, Crestor, Restoril TECHNICAL DESCRIPTION: A registered sleep technologist (RPSGT) was in attendance for the duration of the recording.  Data collection, scoring, video monitoring, and reporting were performed in compliance with the AASM Manual for the Scoring of Sleep and Associated Events; (Hypopnea is scored based on the criteria listed in Section VIII D. 1b in the AASM Manual V2.6 using a 4% oxygen desaturation rule or Hypopnea is scored based on the criteria listed in Section VIII D. 1a in the AASM Manual V2.6 using 3% oxygen desaturation and /or arousal rule).    SLEEP CONTINUITY AND SLEEP ARCHITECTURE:  Lights-out was at 22:08: and lights-on at  05:07:, with  7.0 hours, or 420 minutes of recording time . Total sleep time ( TST) was 341.5 minutes with a decreased sleep efficiency at 82 %. Wake after sleep onset (WASO) time accounted for 28.5 minutes.  Sleep latency was increased at 48.5 minutes.  REM sleep latency was increased at 151.5 minutes. Of the total sleep time, the percentage of stage N1 sleep was 4.4%, stage N2 sleep was 84%, stage N3 sleep was 0.0%, and REM sleep was 11.1%. BODY POSITION:  TST was divided between the following sleep positions: supine 83 minutes (24%), non-supine 259 minutes (76%); right 235 minutes (69%), left 23 minutes (7%), and prone 00  minutes (0%). Total supine REM sleep time was 00 minutes (0% of total REM sleep).  1 Stage R period was observed on this study night, 12 awakenings (i.e. transitions to Stage W from any sleep stage), and 42 total stage transitions.   RESPIRATORY MONITORING:  Based on CMS criteria, there were 0 apneas (0 obstructive; 0 central; 0 mixed), and 1 hypopnea present. Apnea index was 0.0/h. Hypopnea index was 0.2/h.  The  apnea-hypopnea index was 0.2/h overall (AHI was 0.0 supine, 0.2/h in non-supine; AHI was 0.0 REM, there was no supine REM). There were 0 respiratory effort-related arousals (RERAs).   OXIMETRY: Oxyhemoglobin Saturation Nadir during sleep was at 82%  from a mean of 95%.  Of the Total sleep time (TST) hypoxemia (=<88%) was present for 4.5 minutes, or 1.3% of total sleep time.  LIMB MOVEMENTS: There were 134 limb movements of sleep (23.5/h) recorded, of which 1 (0.2/h) was associated with an arousal. AROUSAL: There were 27 arousals in total, for an arousal index of 4 arousals/hour.  Of these, 0 were identified as respiratory-related arousals (0 /h), 1 PLM-related arousal (0 /h), and 26 were non-specific arousals (5 /h). Snoring was classified as not audible. EEG:  PSG EEG was of normal amplitude and frequency, with symmetric manifestation of sleep stages. EKG: The electrocardiogram documented NSR.  The average heart rate during sleep was 67 bpm.  The heart rate during sleep varied between a minimum 59 bpm and a maximum of 75 bpm. AUDIO and VIDEO: The patient adjusted her sleep position several times during sleep, sleep was not fragmented - sustained sleep was seen after midnight, and mostly on her right side.   IMPRESSION: 1) Sleep disordered breathing was not present.   2) Periodic limb movements were not arousal causing. 3) Sleep efficiency was 82%.  Sleep fragmentation was not significant. Total sleep time was reduced at 341.5 minutes.  The reviewed arousals were unrelated to physiological factors that are monitored during a PSG study.   RECOMMENDATIONS: There is no organic factor that I can identify as a cause of nonrestorative sleep, nor can I find an organic cause of Insomnia.   Larey Seat, MD

## 2021-10-02 DIAGNOSIS — N3020 Other chronic cystitis without hematuria: Secondary | ICD-10-CM | POA: Diagnosis not present

## 2021-10-02 DIAGNOSIS — R8271 Bacteriuria: Secondary | ICD-10-CM | POA: Diagnosis not present

## 2021-10-04 ENCOUNTER — Telehealth: Payer: Self-pay | Admitting: Neurology

## 2021-10-04 DIAGNOSIS — S99912A Unspecified injury of left ankle, initial encounter: Secondary | ICD-10-CM | POA: Diagnosis not present

## 2021-10-04 DIAGNOSIS — S92902A Unspecified fracture of left foot, initial encounter for closed fracture: Secondary | ICD-10-CM | POA: Diagnosis not present

## 2021-10-04 DIAGNOSIS — M25572 Pain in left ankle and joints of left foot: Secondary | ICD-10-CM | POA: Diagnosis not present

## 2021-10-04 NOTE — Telephone Encounter (Signed)
Pt called and I informed her of the sleep results and that she can have her primary care send a referral to cognitive behavior therapy to help with insomnia treatment if she still had any concerns. Pt verbalized understanding and was appreciative.

## 2021-10-04 NOTE — Telephone Encounter (Signed)
Pt is asking for a call back from [place], [place] to discuss results

## 2021-10-04 NOTE — Telephone Encounter (Signed)
-----   Message from Larey Seat, MD sent at 09/29/2021  6:49 PM EDT ----- IMPRESSION: 1) Sleep disordered breathing was not present.  2) Periodic limb movements were not arousal causing. 3)Sleep efficiency was82%.Sleep fragmentationwas not significant. Total sleep time was reducedat 341.15mnutes. The reviewed arousals were unrelated to physiological factors that are monitored during a PSG study.   RECOMMENDATIONS: There is no organic factor that I can identify as a cause of nonrestorative sleep, nor can I find an organic cause of Insomnia.

## 2021-10-04 NOTE — Telephone Encounter (Signed)
LVM for pt to call.

## 2021-10-04 NOTE — Telephone Encounter (Signed)
Called patient to discuss sleep study results. No answer at this time. LVM for the patient to call back.   

## 2021-10-18 DIAGNOSIS — M25572 Pain in left ankle and joints of left foot: Secondary | ICD-10-CM | POA: Diagnosis not present

## 2021-11-13 DIAGNOSIS — E785 Hyperlipidemia, unspecified: Secondary | ICD-10-CM | POA: Diagnosis not present

## 2021-11-13 DIAGNOSIS — E559 Vitamin D deficiency, unspecified: Secondary | ICD-10-CM | POA: Diagnosis not present

## 2021-11-13 DIAGNOSIS — R7989 Other specified abnormal findings of blood chemistry: Secondary | ICD-10-CM | POA: Diagnosis not present

## 2021-11-13 DIAGNOSIS — R5383 Other fatigue: Secondary | ICD-10-CM | POA: Diagnosis not present

## 2021-11-20 DIAGNOSIS — R69 Illness, unspecified: Secondary | ICD-10-CM | POA: Diagnosis not present

## 2021-11-20 DIAGNOSIS — I7300 Raynaud's syndrome without gangrene: Secondary | ICD-10-CM | POA: Diagnosis not present

## 2021-11-20 DIAGNOSIS — S99912S Unspecified injury of left ankle, sequela: Secondary | ICD-10-CM | POA: Diagnosis not present

## 2021-11-20 DIAGNOSIS — L219 Seborrheic dermatitis, unspecified: Secondary | ICD-10-CM | POA: Diagnosis not present

## 2021-11-20 DIAGNOSIS — Z0000 Encounter for general adult medical examination without abnormal findings: Secondary | ICD-10-CM | POA: Diagnosis not present

## 2021-11-20 DIAGNOSIS — R350 Frequency of micturition: Secondary | ICD-10-CM | POA: Diagnosis not present

## 2021-11-20 DIAGNOSIS — R82998 Other abnormal findings in urine: Secondary | ICD-10-CM | POA: Diagnosis not present

## 2021-11-20 DIAGNOSIS — E559 Vitamin D deficiency, unspecified: Secondary | ICD-10-CM | POA: Diagnosis not present

## 2021-11-20 DIAGNOSIS — R0989 Other specified symptoms and signs involving the circulatory and respiratory systems: Secondary | ICD-10-CM | POA: Diagnosis not present

## 2021-11-20 DIAGNOSIS — Z1211 Encounter for screening for malignant neoplasm of colon: Secondary | ICD-10-CM | POA: Diagnosis not present

## 2021-11-20 DIAGNOSIS — Z23 Encounter for immunization: Secondary | ICD-10-CM | POA: Diagnosis not present

## 2021-11-20 DIAGNOSIS — G43909 Migraine, unspecified, not intractable, without status migrainosus: Secondary | ICD-10-CM | POA: Diagnosis not present

## 2021-11-20 DIAGNOSIS — R5383 Other fatigue: Secondary | ICD-10-CM | POA: Diagnosis not present

## 2021-11-20 DIAGNOSIS — E785 Hyperlipidemia, unspecified: Secondary | ICD-10-CM | POA: Diagnosis not present

## 2021-12-07 DIAGNOSIS — N952 Postmenopausal atrophic vaginitis: Secondary | ICD-10-CM | POA: Diagnosis not present

## 2021-12-07 DIAGNOSIS — N3021 Other chronic cystitis with hematuria: Secondary | ICD-10-CM | POA: Diagnosis not present

## 2022-01-12 ENCOUNTER — Ambulatory Visit: Payer: Medicare HMO | Admitting: Podiatry

## 2022-01-12 DIAGNOSIS — I7300 Raynaud's syndrome without gangrene: Secondary | ICD-10-CM | POA: Diagnosis not present

## 2022-01-12 DIAGNOSIS — M79674 Pain in right toe(s): Secondary | ICD-10-CM | POA: Diagnosis not present

## 2022-01-17 NOTE — Progress Notes (Signed)
Subjective:   Patient ID: Rebecca Turner, female   DOB: 72 y.o.   MRN: [id number]   HPI Patient presents stating she has been getting some numbness in both feet and some tingling and stinging.  States she has Raynaud's and it has been more bothersome lately and she needs this checked   ROS      Objective:  Physical Exam  Neurovascular status has not changed with patient found to have cool toes and does have some advanced discoloration second and third digits of both feet right over left that appears to be due to cold exposure again this year with Raynaud's-like response.  No ulcerations noted     Assessment:  Patient who does have significant Raynaud's phenomena but does not have palpable vascular disease     Plan:  H&P reviewed condition and at this point I have recommended more protection from cold along with consideration of seeing a vascular doctor even though I do not think there is anything that they will be able to do which will be of value for her.  Patient will be seen back all kinds of education rendered to her today and assurances that with no ulceration that this is not a dangerous situation

## 2022-01-30 ENCOUNTER — Other Ambulatory Visit: Payer: Self-pay

## 2022-01-30 ENCOUNTER — Other Ambulatory Visit: Payer: Self-pay | Admitting: Vascular Surgery

## 2022-01-30 DIAGNOSIS — I998 Other disorder of circulatory system: Secondary | ICD-10-CM

## 2022-01-30 DIAGNOSIS — I7300 Raynaud's syndrome without gangrene: Secondary | ICD-10-CM

## 2022-02-01 ENCOUNTER — Ambulatory Visit (HOSPITAL_COMMUNITY)
Admission: RE | Admit: 2022-02-01 | Discharge: 2022-02-01 | Disposition: A | Discharge status: Home / Self Care | Payer: Medicare HMO | Source: Ambulatory Visit | Attending: Vascular Surgery | Admitting: Vascular Surgery

## 2022-02-01 DIAGNOSIS — I998 Other disorder of circulatory system: Secondary | ICD-10-CM | POA: Diagnosis not present

## 2022-02-01 DIAGNOSIS — I7300 Raynaud's syndrome without gangrene: Secondary | ICD-10-CM | POA: Insufficient documentation

## 2022-02-02 ENCOUNTER — Ambulatory Visit: Payer: Medicare HMO | Admitting: Vascular Surgery

## 2022-02-02 ENCOUNTER — Encounter: Payer: Self-pay | Admitting: Vascular Surgery

## 2022-02-02 VITALS — BP 143/84 | HR 75 | Temp 97.8°F | Resp 20 | Ht 62.5 in | Wt 116.0 lb

## 2022-02-02 DIAGNOSIS — I7300 Raynaud's syndrome without gangrene: Secondary | ICD-10-CM | POA: Diagnosis not present

## 2022-02-02 NOTE — Progress Notes (Signed)
Office Note     CC: Bilateral lower extremity Raynaud's Requesting Provider:  Prince Solian, MD  HPI: Rebecca Turner is a 72 y.o. (02/02/1950) female presenting at the request of .Prince Solian, MD for longstanding history of bilateral lower extremity Raynaud's, with recent wounds.  On exam today, Rebecca Turner was doing well.  Rebecca Turner was diagnosed 20+ years ago with Raynaud's syndrome.  This is hereditary, and she remembers her mother having similar blue feet.  The blanching discoloration followed by blue and red discoloration mainly occurs with decreased temperature.  She appreciates that in the wintertime, as well as in the summertime when entering the grocery store.  She is a non-smoker, who denies symptoms of claudication.  She has had wounds on her toes, but these have healed in the past.  Currently has a toe wound between her first and second digit that appears to be from the toes rubbing together.    The pt is  on a statin for cholesterol management.  The pt is not on a daily aspirin.   Other AC:  - The pt is nto on medication for hypertension.   The pt is not diabetic.  Tobacco hx:  -  Past Medical History:  Diagnosis Date   Abnormal Pap smear of cervix    Anxiety    Cataract, immature    Chest pain    CMC DJD(carpometacarpal degenerative joint disease), localized primary 09/01/2012   left thumb   Dental crowns present    DOE (dyspnea on exertion)    GERD (gastroesophageal reflux disease)    Hyperkeratosis of cervix    Hyperlipidemia    Labial cyst    1 1/2 cm cyst on right sup labia maj   Migraine headache    Migraines    Osteopenia    Raynaud's disease    Wears glasses     Past Surgical History:  Procedure Laterality Date   CARPOMETACARPEL SUSPENSION PLASTY Right 12/23/2012   Procedure: RIGHT HAND TRAPEZIUM EXCISION WITH RIGHT THUMB CARPOMETACARPEL ([place]) SUSPENSION PLASTY;  Surgeon: Cammie Sickle., MD;  Location: [facility];   Service: Orthopedics;  Laterality: Right;   DORSAL COMPARTMENT RELEASE Left 09/11/2012   Procedure: TRAPEZIUM EXCISION FOR TRANSFER FIRST DORSAL RELEASE;  Surgeon: Cammie Sickle., MD;  Location: [facility];  Service: Orthopedics;  Laterality: Left;   NASAL FRACTURE SURGERY     OTOPLASTY     as a child   TONSILLECTOMY     as a child    Social History   Socioeconomic History   Marital status: Single    Spouse name: Not on file   Number of children: 1   Years of education: Not on file   Highest education level: Bachelor's degree (e.g., BA, AB, BS)  Occupational History   Not on file  Tobacco Use   Smoking status: Never   Smokeless tobacco: Never  Vaping Use   Vaping Use: Never used  Substance and Sexual Activity   Alcohol use: Yes    Alcohol/week: 2.0 standard drinks of alcohol    Types: 2 Standard drinks or equivalent per week    Comment: rarely   Drug use: No   Sexual activity: Yes    Partners: Male    Birth control/protection: Post-menopausal  Other Topics Concern   Not on file  Social History Narrative   Lives at home w her dogs   R handed   Caffeine: 2 C of coffee a day  Social Determinants of Health   Financial Resource Strain: Not on file  Food Insecurity: Not on file  Transportation Needs: Not on file  Physical Activity: Not on file  Stress: Not on file  Social Connections: Not on file  Intimate Partner Violence: Not on file    Family History  Problem Relation Age of Onset   Osteoporosis Mother    Stroke Mother    Hypertension Mother    Heart attack Mother    Stroke Maternal Grandmother    Hypertension Maternal Grandmother    Leukemia Father     Current Outpatient Medications  Medication Sig Dispense Refill   ALPRAZolam (XANAX) 0.5 MG tablet   2   Cholecalciferol (VITAMIN D3) 50 MCG (2000 UT) TABS Take 1 tablet by mouth daily.     clobetasol ointment (TEMOVATE) 0.05 % Apply topically 2 times daily up for up 5 days 30 g 2    Cranberry-Vitamin C-Probiotic (AZO CRANBERRY PO) Take 1 tablet by mouth 2 (two) times daily.     escitalopram (LEXAPRO) 20 MG tablet Take 20 mg by mouth daily.  1   estradiol (ESTRACE) 0.1 MG/GM vaginal cream 1 apply small amount topically two to three times weekly. 42.5 g 1   Lactobacillus-Inulin (CULTURELLE DIGESTIVE DAILY) CAPS Take by mouth daily.     rosuvastatin (CRESTOR) 10 MG tablet Take 10 mg by mouth daily.     temazepam (RESTORIL) 30 MG capsule      No current facility-administered medications for this visit.    Allergies  Allergen Reactions   Serzone [Nefazodone] Itching     REVIEW OF SYSTEMS:   '[X]'$  denotes positive finding, '[ ]'$  denotes negative finding Cardiac  Comments:  Chest pain or chest pressure:    Shortness of breath upon exertion:    Short of breath when lying flat:    Irregular heart rhythm:        Vascular    Pain in calf, thigh, or hip brought on by ambulation:    Pain in feet at night that wakes you up from your sleep:     Blood clot in your veins:    Leg swelling:         Pulmonary    Oxygen at home:    Productive cough:     Wheezing:         Neurologic    Sudden weakness in arms or legs:     Sudden numbness in arms or legs:     Sudden onset of difficulty speaking or slurred speech:    Temporary loss of vision in one eye:     Problems with dizziness:         Gastrointestinal    Blood in stool:     Vomited blood:         Genitourinary    Burning when urinating:     Blood in urine:        Psychiatric    Major depression:         Hematologic    Bleeding problems:    Problems with blood clotting too easily:        Skin    Rashes or ulcers:        Constitutional    Fever or chills:      PHYSICAL EXAMINATION:  Vitals:   02/02/22 1109  BP: (!) 143/84  Pulse: 75  Resp: 20  Temp: 97.8 F (36.6 C)  SpO2: 98%  Weight: 116 lb (52.6 kg)  Height: 5'  2.5" (1.588 m)    General:  WDWN in NAD; vital signs documented above Gait:  Not observed HENT: WNL, normocephalic Pulmonary: normal non-labored breathing , without wheezing Cardiac: regular HR Abdomen: soft, NT, no masses Skin: without rashes Vascular Exam/Pulses:  Right Left  Radial 2+ (normal) 2+ (normal)  Ulnar    Femoral    Popliteal    DP 2+ (normal) 2+ (normal)  PT 2+ (normal) 2+ (normal)   Extremities: without ischemic changes, without Gangrene , without cellulitis; with open wounds;  Musculoskeletal: no muscle wasting or atrophy  Neurologic: A&O X 3;  No focal weakness or paresthesias are detected Psychiatric:  The pt has Normal affect.   Non-Invasive Vascular Imaging:   ABI Findings:  +---------+------------------+-----+-----------+--------+  Right   Rt Pressure (mmHg)IndexWaveform   Comment   +---------+------------------+-----+-----------+--------+  Brachial 159                                         +---------+------------------+-----+-----------+--------+  PTA     185               1.13 triphasic            +---------+------------------+-----+-----------+--------+  PERO    161               0.98 biphasic             +---------+------------------+-----+-----------+--------+  DP      186               1.13 multiphasic          +---------+------------------+-----+-----------+--------+  Great Toe67                0.41 Abnormal             +---------+------------------+-----+-----------+--------+   +---------+------------------+-----+-----------+---------------------------  ----+  Left    Lt Pressure (mmHg)IndexWaveform   Comment                           +---------+------------------+-----+-----------+---------------------------  ----+  Brachial 164                                                                 +---------+------------------+-----+-----------+---------------------------  ----+  PTA     194               1.18 multiphasic                                   +---------+------------------+-----+-----------+---------------------------  ----+  PERO                           monophasic sounded multiphasic,  difficult                                              to record                         +---------+------------------+-----+-----------+---------------------------  ----+  DP      178               1.09 triphasic                                    +---------+------------------+-----+-----------+---------------------------  ----+  Great Toe148               0.90 Normal                                       +---------+------------------+-----+-----------+---------------------------  ----+   +-------+-----------+-----------+------------+------------+  ABI/TBIToday's ABIToday's TBIPrevious ABIPrevious TBI  +-------+-----------+-----------+------------+------------+  Right 1.13       0.41                                 +-------+-----------+-----------+------------+------------+  Left  1.18       0.90                                 +-------+-----------+-----------+------------+------------+      TOES Findings:  +----------+---------------+--------+-------+  Right ToesPressure (mmHg)WaveformComment  +----------+---------------+--------+-------+  1st Digit                Normal           +----------+---------------+--------+-------+  2nd Digit                Normal           +----------+---------------+--------+-------+  3rd Digit                Normal           +----------+---------------+--------+-------+  4th Digit                Abnormal         +----------+---------------+--------+-------+  5th Digit                Abnormal         +----------+---------------+--------+-------+      +---------+---------------+--------+-------+  Left ToesPressure (mmHg)WaveformComment  +---------+---------------+--------+-------+  1st Digit               Normal            +---------+---------------+--------+-------+  2nd Digit               Abnormal         +---------+---------------+--------+-------+  3rd Digit               Normal           +---------+---------------+--------+-------+  4th Digit               Normal           +---------+---------------+--------+-------+  5th Digit               Normal           +---------+---------------+--------+-------+   PPG tracings obtained after warming with a blanket     Summary:  Right: Resting right ankle-brachial index is within normal range. The  right toe-brachial index is abnormal.   Left: Resting left ankle-brachial index is within normal range. The left  toe-brachial index is normal.     ASSESSMENT/PLAN: Rebecca Turner is a 72 y.o. female presenting  with worsening symptoms of Raynaud's phenomenon.  She has had waxing and waning pain that is cold related for over 20 years, but noted a recent wound, spurring some concern for peripheral arterial disease.  She initially had 2 wounds on the base of her first toes, which have healed.  The most recent wound is in between her first and second toe on her right foot.  This appears to be a small ulceration, that is rich in induced, and is healing.  On physical exam, she had a palpable pulse in the foot, with ABIs demonstrating normal waveforms to the level of the ankle.  There is some abnormal waveforms in the toes.  There are several treatments for Raynaud's. First-line therapy is reducing exposure to cold, and eliminating ergot's such as caffeine which can cause vasospasm. We will also try nifedipine, which is a calcium channel blocker, which can increase vasodilation in the fingers and toes. Should nifedipine not work, or have a short time span of effect, we can also try amlodipine. Phosphodiesterase 5 inhibitors are second line therapy but have also been proven to be beneficial From a topical standpoint, nitric oxide  can be used should the above not work.   I had a long conversation with Rebecca Turner regarding the above.  I plan to prescribe nifedipine for her to try.  She does not have any cardiac conditions that she or I am aware of. I have outlined the above so Dr. Radene Gunning can oversee medication changes should the nifedipine prove inadequate.  I am happy to see her in clinic should the above not work, furthermore, I asked that she immediately call my office should new wounds arise that do not heal as this would necessitate further workup for secondary raynaud's, with possible angiography.  Broadus John, MD [facility] [phone]

## 2022-02-04 LAB — VAS US ABI WITH/WO TBI
Left ABI: 1.18
Right ABI: 1.13

## 2022-02-05 ENCOUNTER — Other Ambulatory Visit: Payer: Self-pay

## 2022-02-05 ENCOUNTER — Telehealth: Payer: Self-pay

## 2022-02-05 MED ORDER — NIFEDIPINE ER OSMOTIC RELEASE 30 MG PO TB24: 30.0000 mg | ORAL_TABLET | Freq: Every day | ORAL | Status: DC

## 2022-02-05 NOTE — Addendum Note (Signed)
Addended by: Orlie Pollen E on: 02/05/2022 11:30 AM   Modules accepted: Orders

## 2022-02-05 NOTE — Telephone Encounter (Signed)
Pt called stating that she saw Dr. Virl Cagey on Friday and the medication he spoke about was not sent in to [facility].  Reviewed pt's chart, sent staff msg to Dr. Virl Cagey. He replied stating that he sent it in, but it was put into the system as clinic administered, so it didn't go to the pharmacy. Called [facility] and gave verbal order. Called pt, no answer, lf vm relaying prescription info.

## 2022-02-07 DIAGNOSIS — N3021 Other chronic cystitis with hematuria: Secondary | ICD-10-CM | POA: Diagnosis not present

## 2022-02-26 ENCOUNTER — Other Ambulatory Visit: Payer: Self-pay

## 2022-02-26 DIAGNOSIS — H93291 Other abnormal auditory perceptions, right ear: Secondary | ICD-10-CM | POA: Diagnosis not present

## 2022-02-26 DIAGNOSIS — H903 Sensorineural hearing loss, bilateral: Secondary | ICD-10-CM | POA: Diagnosis not present

## 2022-02-26 DIAGNOSIS — H6121 Impacted cerumen, right ear: Secondary | ICD-10-CM | POA: Diagnosis not present

## 2022-02-26 DIAGNOSIS — H6991 Unspecified Eustachian tube disorder, right ear: Secondary | ICD-10-CM | POA: Diagnosis not present

## 2022-02-26 MED ORDER — NIFEDIPINE ER OSMOTIC RELEASE 30 MG PO TB24: 30.0000 mg | ORAL_TABLET | Freq: Every day | ORAL | 3 refills | Status: AC

## 2022-02-26 NOTE — Telephone Encounter (Signed)
Pt called requesting a 90 day supply of Nifedipine. She states that her symptoms have improved and she feels that the medication is working. Med refilled as a 90 day supply.   Called pt to inform her, no answer, lf vm.

## 2022-03-12 ENCOUNTER — Other Ambulatory Visit: Payer: Self-pay | Admitting: Internal Medicine

## 2022-03-12 DIAGNOSIS — Z1231 Encounter for screening mammogram for malignant neoplasm of breast: Secondary | ICD-10-CM

## 2022-04-09 ENCOUNTER — Telehealth: Payer: Self-pay

## 2022-04-09 NOTE — Telephone Encounter (Signed)
Patient left a voicemail about having soreness a dn swelling in her right great toe. I attempted to call the patient back no answer left a voicemail for the patient to return call to the office.

## 2022-04-10 ENCOUNTER — Telehealth: Payer: Self-pay | Admitting: *Deleted

## 2022-04-10 NOTE — Telephone Encounter (Signed)
Patient is calling back asking for recommendations on a medication that was prescribed for sores on her feet at her last visit. Explained that there was nothing mentioned in epic notes of this medication and since it has been a while of the last visit, she will need to schedule a f/u appointment for re evaluation of the feet, verbalized understanding and will call back if she decides to schedule.

## 2022-04-11 DIAGNOSIS — Z1211 Encounter for screening for malignant neoplasm of colon: Secondary | ICD-10-CM | POA: Diagnosis not present

## 2022-04-18 ENCOUNTER — Encounter: Payer: Self-pay | Admitting: Podiatry

## 2022-04-18 ENCOUNTER — Ambulatory Visit: Payer: Medicare HMO | Admitting: Podiatry

## 2022-04-18 ENCOUNTER — Ambulatory Visit (INDEPENDENT_AMBULATORY_CARE_PROVIDER_SITE_OTHER): Payer: Medicare HMO

## 2022-04-18 DIAGNOSIS — I7300 Raynaud's syndrome without gangrene: Secondary | ICD-10-CM | POA: Diagnosis not present

## 2022-04-19 NOTE — Progress Notes (Signed)
Subjective:   Patient ID: Rebecca Turner, female   DOB: 72 y.o.   MRN: [id number]   HPI Patient presents just concerned about continued lesions on her toes and whether there is anything that can be done with this and that she started amlodipine which seems to be helping a little bit   ROS      Objective:  Physical Exam  Neurovascular status unchanged with patient having Raynaud's and did have cold exposure with distal necrotic areas bilateral no ulcerations     Assessment:  This is more related to the Raynaud's phenomena versus any other pathology     Plan:  Reviewed with patient recommended being very careful about cold exposure but I do not see any magical treatments that would be of benefit.  Patient will be seen back as needed if ulcers were to occur or if offloading is necessary

## 2022-04-27 ENCOUNTER — Ambulatory Visit
Admission: RE | Admit: 2022-04-27 | Discharge: 2022-04-27 | Disposition: A | Discharge status: Home / Self Care | Payer: Medicare HMO | Source: Ambulatory Visit | Attending: Internal Medicine | Admitting: Internal Medicine

## 2022-04-27 ENCOUNTER — Ambulatory Visit: Payer: Medicare HMO

## 2022-04-27 DIAGNOSIS — Z1231 Encounter for screening mammogram for malignant neoplasm of breast: Secondary | ICD-10-CM | POA: Diagnosis not present

## 2022-05-22 DIAGNOSIS — R5383 Other fatigue: Secondary | ICD-10-CM | POA: Diagnosis not present

## 2022-05-22 DIAGNOSIS — E559 Vitamin D deficiency, unspecified: Secondary | ICD-10-CM | POA: Diagnosis not present

## 2022-05-22 DIAGNOSIS — R350 Frequency of micturition: Secondary | ICD-10-CM | POA: Diagnosis not present

## 2022-05-22 DIAGNOSIS — G43909 Migraine, unspecified, not intractable, without status migrainosus: Secondary | ICD-10-CM | POA: Diagnosis not present

## 2022-05-22 DIAGNOSIS — R0989 Other specified symptoms and signs involving the circulatory and respiratory systems: Secondary | ICD-10-CM | POA: Diagnosis not present

## 2022-05-22 DIAGNOSIS — I7300 Raynaud's syndrome without gangrene: Secondary | ICD-10-CM | POA: Diagnosis not present

## 2022-05-22 DIAGNOSIS — L219 Seborrheic dermatitis, unspecified: Secondary | ICD-10-CM | POA: Diagnosis not present

## 2022-05-22 DIAGNOSIS — F4325 Adjustment disorder with mixed disturbance of emotions and conduct: Secondary | ICD-10-CM | POA: Diagnosis not present

## 2022-05-22 DIAGNOSIS — K219 Gastro-esophageal reflux disease without esophagitis: Secondary | ICD-10-CM | POA: Diagnosis not present

## 2022-05-22 DIAGNOSIS — G4700 Insomnia, unspecified: Secondary | ICD-10-CM | POA: Diagnosis not present

## 2022-05-22 DIAGNOSIS — S99912S Unspecified injury of left ankle, sequela: Secondary | ICD-10-CM | POA: Diagnosis not present

## 2022-05-22 DIAGNOSIS — E785 Hyperlipidemia, unspecified: Secondary | ICD-10-CM | POA: Diagnosis not present

## 2022-05-29 DIAGNOSIS — H532 Diplopia: Secondary | ICD-10-CM | POA: Diagnosis not present

## 2022-08-03 ENCOUNTER — Ambulatory Visit: Payer: Medicare HMO

## 2022-08-03 ENCOUNTER — Encounter (HOSPITAL_COMMUNITY): Payer: Medicare HMO

## 2022-09-14 ENCOUNTER — Ambulatory Visit: Payer: Medicare HMO

## 2022-09-14 ENCOUNTER — Encounter (HOSPITAL_COMMUNITY): Payer: Medicare HMO

## 2022-09-29 ENCOUNTER — Other Ambulatory Visit: Payer: Self-pay | Admitting: Internal Medicine

## 2022-09-29 DIAGNOSIS — E559 Vitamin D deficiency, unspecified: Secondary | ICD-10-CM

## 2022-09-29 DIAGNOSIS — M8588 Other specified disorders of bone density and structure, other site: Secondary | ICD-10-CM

## 2022-10-03 DIAGNOSIS — H5213 Myopia, bilateral: Secondary | ICD-10-CM | POA: Diagnosis not present

## 2022-10-03 DIAGNOSIS — H11442 Conjunctival cysts, left eye: Secondary | ICD-10-CM | POA: Diagnosis not present

## 2022-10-03 DIAGNOSIS — Z961 Presence of intraocular lens: Secondary | ICD-10-CM | POA: Diagnosis not present

## 2022-11-27 DIAGNOSIS — E559 Vitamin D deficiency, unspecified: Secondary | ICD-10-CM | POA: Diagnosis not present

## 2022-11-27 DIAGNOSIS — E785 Hyperlipidemia, unspecified: Secondary | ICD-10-CM | POA: Diagnosis not present

## 2022-11-27 DIAGNOSIS — Z1212 Encounter for screening for malignant neoplasm of rectum: Secondary | ICD-10-CM | POA: Diagnosis not present

## 2022-12-04 DIAGNOSIS — G43909 Migraine, unspecified, not intractable, without status migrainosus: Secondary | ICD-10-CM | POA: Diagnosis not present

## 2022-12-04 DIAGNOSIS — E559 Vitamin D deficiency, unspecified: Secondary | ICD-10-CM | POA: Diagnosis not present

## 2022-12-04 DIAGNOSIS — Z1211 Encounter for screening for malignant neoplasm of colon: Secondary | ICD-10-CM | POA: Diagnosis not present

## 2022-12-04 DIAGNOSIS — G4700 Insomnia, unspecified: Secondary | ICD-10-CM | POA: Diagnosis not present

## 2022-12-04 DIAGNOSIS — Z1339 Encounter for screening examination for other mental health and behavioral disorders: Secondary | ICD-10-CM | POA: Diagnosis not present

## 2022-12-04 DIAGNOSIS — R82998 Other abnormal findings in urine: Secondary | ICD-10-CM | POA: Diagnosis not present

## 2022-12-04 DIAGNOSIS — L219 Seborrheic dermatitis, unspecified: Secondary | ICD-10-CM | POA: Diagnosis not present

## 2022-12-04 DIAGNOSIS — Z1331 Encounter for screening for depression: Secondary | ICD-10-CM | POA: Diagnosis not present

## 2022-12-04 DIAGNOSIS — F4325 Adjustment disorder with mixed disturbance of emotions and conduct: Secondary | ICD-10-CM | POA: Diagnosis not present

## 2022-12-04 DIAGNOSIS — E785 Hyperlipidemia, unspecified: Secondary | ICD-10-CM | POA: Diagnosis not present

## 2022-12-04 DIAGNOSIS — I7300 Raynaud's syndrome without gangrene: Secondary | ICD-10-CM | POA: Diagnosis not present

## 2022-12-04 DIAGNOSIS — Z0000 Encounter for general adult medical examination without abnormal findings: Secondary | ICD-10-CM | POA: Diagnosis not present

## 2022-12-04 DIAGNOSIS — R350 Frequency of micturition: Secondary | ICD-10-CM | POA: Diagnosis not present

## 2022-12-12 DIAGNOSIS — N3020 Other chronic cystitis without hematuria: Secondary | ICD-10-CM | POA: Diagnosis not present

## 2022-12-17 DIAGNOSIS — H11442 Conjunctival cysts, left eye: Secondary | ICD-10-CM | POA: Diagnosis not present

## 2023-01-03 NOTE — Progress Notes (Signed)
 73 y.o. G2P1 Single White or Caucasian female here for breast and pelvic exam.  H/o recurrent UTIs.  Has seen urology.  She continues to take a probiotic.  Only had one UTI.  Using vaginal estrogen cream.  Does not need RF right now.  Denies vaginal bleeding.  H/o osteopenia.  Last BMD was 2021.  Discussed today.  She will have a repeat BMD done with Rebecca Turner (as the scheduling at the breast center is months out).    Using clobetasol  ointment when needed.  No current issues.  Does not need RF.   Has cystic area near vagina she wants me to assess.    Patient's last menstrual period was 01/02/1999.          Sexually active: Yes.    H/O STD:  no  Health Maintenance: PCP:  Rebecca Turner.  Last wellness appt was December.  Did blood work at that appt:  yes Vaccines are up to date:  reviewed with pt.  Has not done shingrix, influenze or RSV. Colonoscopy:  last cologuard result that I have was 2020.  Pt reports she did one in 2024.   MMG:  04/27/2022 Negative BMD:  08/20/2019 Osteopenia Last pap smear:  02/10/2019 Negative.   H/o abnormal pap smear:      reports that she has never smoked. She has never used smokeless tobacco. She reports current alcohol  use of about 2.0 standard drinks of alcohol  per week. She reports that she does not use drugs.  Past Medical History:  Diagnosis Date   Abnormal Pap smear of cervix    Anxiety    Cataract, immature    Chest pain    CMC DJD(carpometacarpal degenerative joint disease), localized primary 09/01/2012   left thumb   Dental crowns present    DOE (dyspnea on exertion)    GERD (gastroesophageal reflux disease)    Hyperkeratosis of cervix    Hyperlipidemia    Labial cyst    1 1/2 cm cyst on right sup labia maj   Migraine headache    Migraines    Osteopenia    Raynaud's disease    Wears glasses     Past Surgical History:  Procedure Laterality Date   CARPOMETACARPEL SUSPENSION PLASTY Right 12/23/2012   Procedure: RIGHT HAND TRAPEZIUM EXCISION  WITH RIGHT THUMB CARPOMETACARPEL (CMC) SUSPENSION PLASTY;  Surgeon: Lamar LULLA Leonor Mickey., MD;  Location: [facility];  Service: Orthopedics;  Laterality: Right;   DORSAL COMPARTMENT RELEASE Left 09/11/2012   Procedure: TRAPEZIUM EXCISION FOR TRANSFER FIRST DORSAL RELEASE;  Surgeon: Lamar LULLA Leonor Mickey., MD;  Location: [facility];  Service: Orthopedics;  Laterality: Left;   NASAL FRACTURE SURGERY     OTOPLASTY     as a child   TONSILLECTOMY     as a child    Current Outpatient Medications  Medication Sig Dispense Refill   ALPRAZolam (XANAX) 0.5 MG tablet   2   Cholecalciferol (VITAMIN D3) 50 MCG (2000 UT) TABS Take 1 tablet by mouth daily.     clobetasol  ointment (TEMOVATE ) 0.05 % Apply topically 2 times daily up for up 5 days 30 g 2   Cranberry-Vitamin C-Probiotic (AZO CRANBERRY PO) Take 1 tablet by mouth 2 (two) times daily.     DULoxetine HCl (CYMBALTA PO) Take by mouth.     estradiol  (ESTRACE ) 0.1 MG/GM vaginal cream 1 apply small amount topically two to three times weekly. 42.5 g 1   Lactobacillus-Inulin (CULTURELLE DIGESTIVE DAILY) CAPS Take  by mouth daily.     NIFEdipine  (PROCARDIA -XL/NIFEDICAL-XL) 30 MG 24 hr tablet Take 1 tablet (30 mg total) by mouth daily. 90 tablet 3   rosuvastatin (CRESTOR) 10 MG tablet Take 10 mg by mouth daily.     temazepam (RESTORIL) 30 MG capsule      No current facility-administered medications for this visit.    Family History  Problem Relation Age of Onset   Osteoporosis Mother    Stroke Mother    Hypertension Mother    Heart attack Mother    Stroke Maternal Grandmother    Hypertension Maternal Grandmother    Leukemia Father     Review of Systems  Constitutional: Negative.   Genitourinary: Negative.     Exam:   BP 133/73 (BP Location: Left Arm, Patient Position: Sitting, Cuff Size: Normal)   Pulse 80   Ht 5' 2.25 (1.581 m)   Wt 103 lb 3.2 oz (46.8 kg)   LMP 01/02/1999   BMI 18.72 kg/m   Height: 5' 2.25  (158.1 cm)  General appearance: alert, cooperative and appears stated age Breasts: normal appearance, no masses or tenderness Abdomen: soft, non-tender; bowel sounds normal; no masses,  no organomegaly Lymph nodes: Cervical, supraclavicular, and axillary nodes normal.  No abnormal inguinal nodes palpated Neurologic: Grossly normal  Pelvic: External genitalia:  small single sebaceous cyst on inner right labia majora              Urethra:  normal appearing urethra with no masses, tenderness or lesions              Bartholins and Skenes: normal                 Vagina: normal appearing vagina with atrophic changes and no discharge, no lesions              Cervix: no lesions              Pap taken: No. Bimanual Exam:  Uterus:  normal size, contour, position, consistency, mobility, non-tender              Adnexa: normal adnexa and no mass, fullness, tenderness               Rectovaginal: Confirms               Anus:  normal sphincter tone, no lesions  Chaperone, Rebecca Turner, CMA, was present for exam.  Assessment/Plan: 1. Encntr for gyn exam (general) (routine) w/o abn findings (Primary) - Pap smear 2021.  Reviewed guidelines today.   - Mammogram 04/2022 - Cologuard done 2024 pt pt.  Result not in care everywhere - Bone mineral density will be done with Rebecca Turner this year - lab work done with PCP, Rebecca Turner - vaccines reviewed/updated  2. Lichen sclerosus - exam is stable.  No thickening or ulcerations.  Does not need steroid RF at this time.  3. Osteopenia, unspecified location  4. Postmenopausal - not on HRT but using some vaginal estradiol  cream.  Also, does not need RF at this time.

## 2023-01-04 ENCOUNTER — Ambulatory Visit (HOSPITAL_BASED_OUTPATIENT_CLINIC_OR_DEPARTMENT_OTHER): Payer: Medicare HMO | Admitting: Obstetrics & Gynecology

## 2023-01-04 ENCOUNTER — Encounter (HOSPITAL_BASED_OUTPATIENT_CLINIC_OR_DEPARTMENT_OTHER): Payer: Self-pay | Admitting: Obstetrics & Gynecology

## 2023-01-04 VITALS — BP 133/73 | HR 80 | Ht 62.25 in | Wt 103.2 lb

## 2023-01-04 DIAGNOSIS — Z780 Asymptomatic menopausal state: Secondary | ICD-10-CM | POA: Diagnosis not present

## 2023-01-04 DIAGNOSIS — L900 Lichen sclerosus et atrophicus: Secondary | ICD-10-CM | POA: Diagnosis not present

## 2023-01-04 DIAGNOSIS — M8580 Other specified disorders of bone density and structure, unspecified site: Secondary | ICD-10-CM | POA: Diagnosis not present

## 2023-01-04 DIAGNOSIS — Z01419 Encounter for gynecological examination (general) (routine) without abnormal findings: Secondary | ICD-10-CM

## 2023-03-01 DIAGNOSIS — Z79899 Other long term (current) drug therapy: Secondary | ICD-10-CM | POA: Diagnosis not present

## 2023-04-17 DIAGNOSIS — N3020 Other chronic cystitis without hematuria: Secondary | ICD-10-CM | POA: Diagnosis not present

## 2023-04-23 ENCOUNTER — Other Ambulatory Visit (HOSPITAL_BASED_OUTPATIENT_CLINIC_OR_DEPARTMENT_OTHER): Payer: Self-pay | Admitting: Obstetrics & Gynecology

## 2023-06-28 ENCOUNTER — Ambulatory Visit

## 2023-06-28 DIAGNOSIS — Z780 Asymptomatic menopausal state: Secondary | ICD-10-CM

## 2023-06-28 DIAGNOSIS — M8588 Other specified disorders of bone density and structure, other site: Secondary | ICD-10-CM

## 2023-06-28 DIAGNOSIS — E559 Vitamin D deficiency, unspecified: Secondary | ICD-10-CM

## 2023-07-09 IMAGING — MG MM DIGITAL SCREENING BILAT W/ TOMO AND CAD
8 series · 9 of 24 positions shown · non-contrast
Comparison: Previous exam(s).

CLINICAL DATA: Screening.

EXAM:
DIGITAL SCREENING BILATERAL MAMMOGRAM WITH TOMOSYNTHESIS AND CAD
TECHNIQUE: Bilateral screening digital craniocaudal and mediolateral oblique
mammograms were obtained. Bilateral screening digital breast
tomosynthesis was performed. The images were evaluated with
computer-aided detection.

[R CC synth-2D]
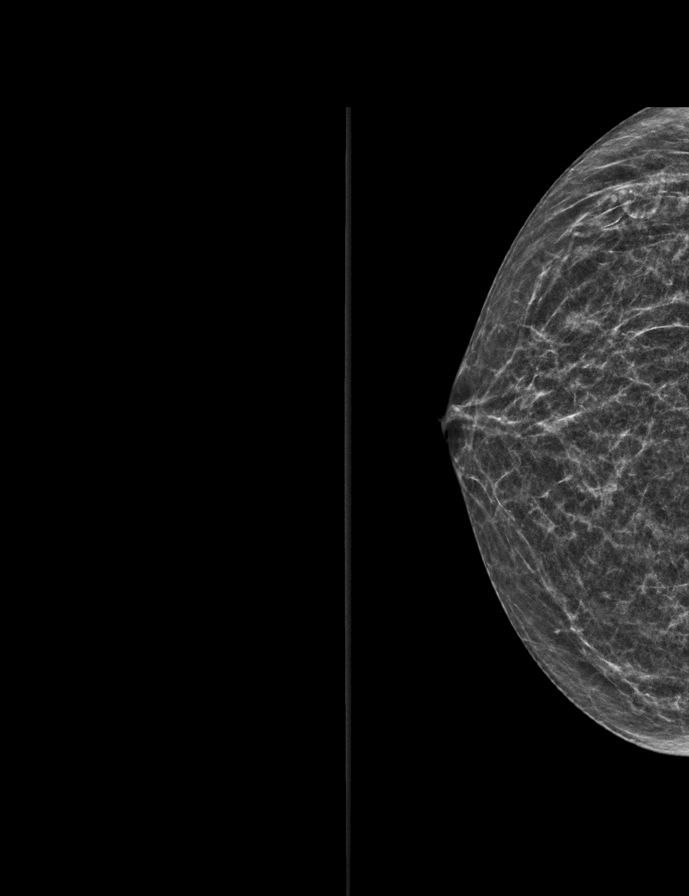

[L MLO synth-2D]
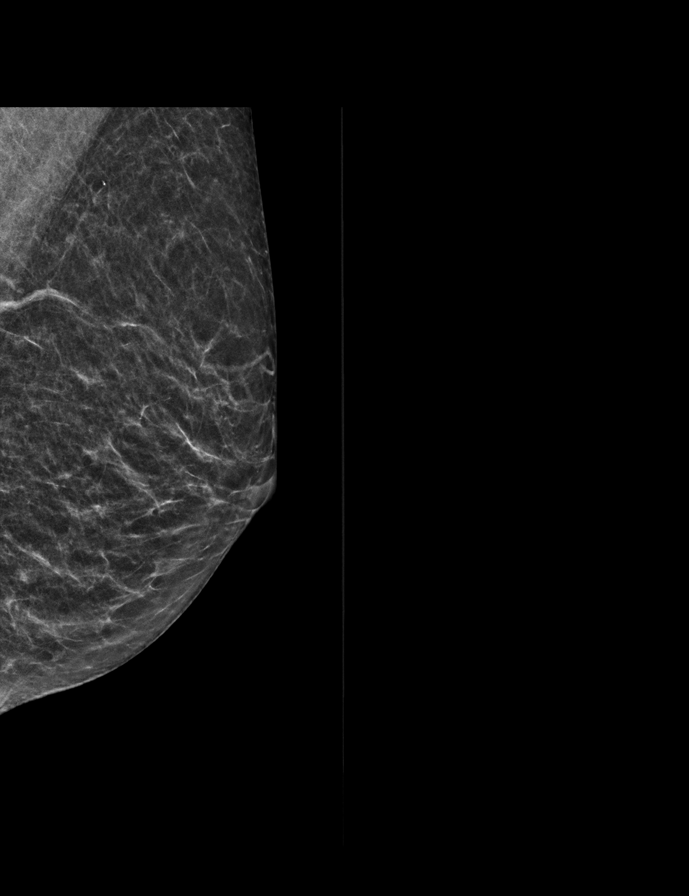

[L CC synth-2D]
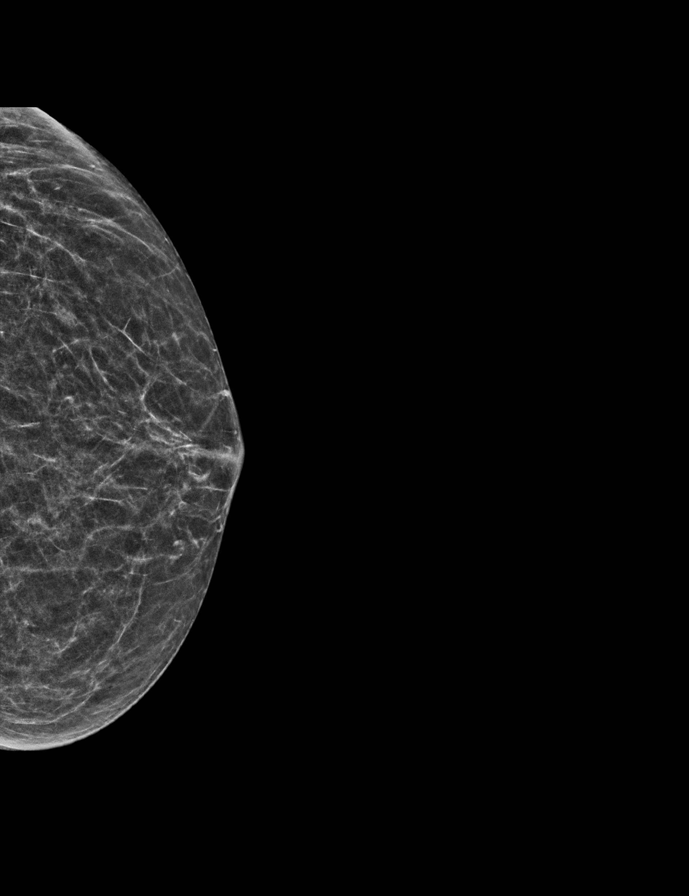

[R MLO synth-2D]
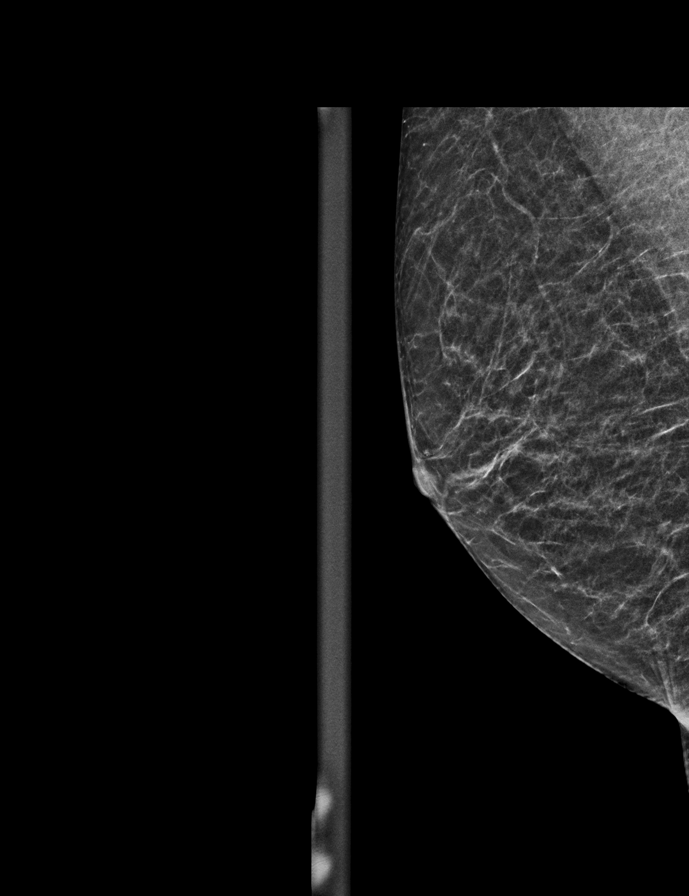

[R MLO tomo · 2 of 38 frames shown]
[frame 13/38]
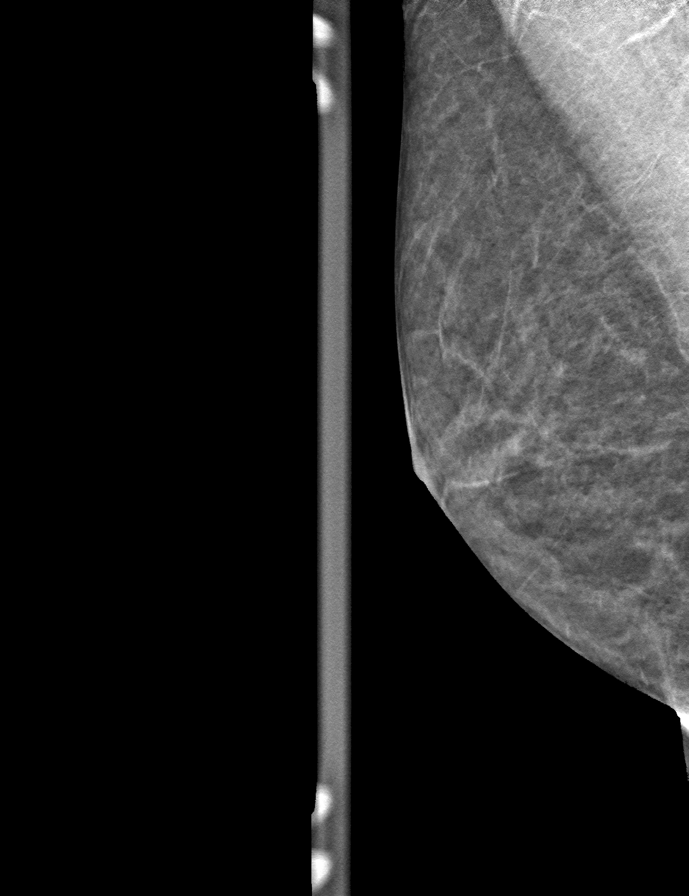
[frame 19/38]
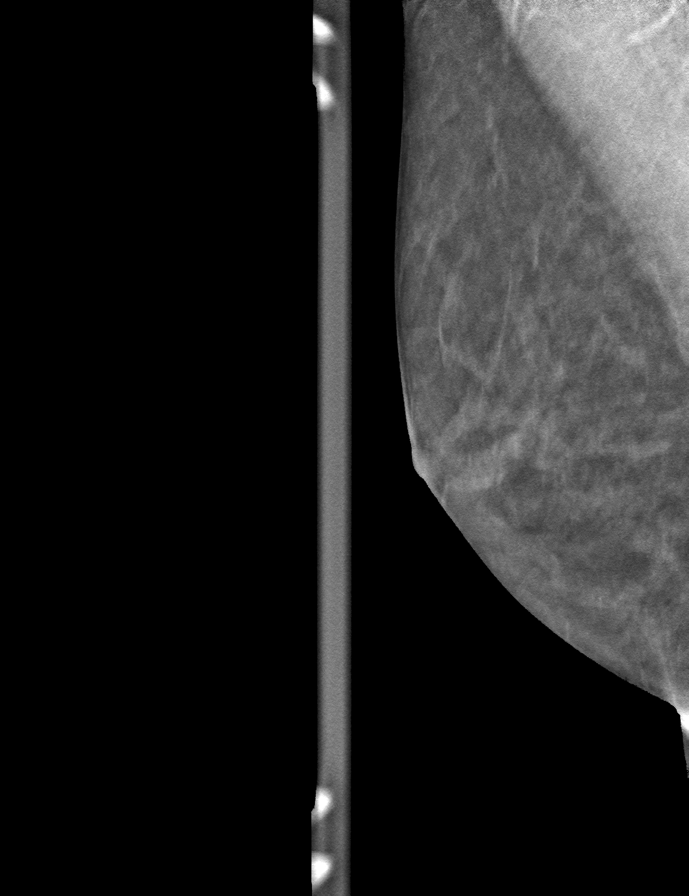

[L CC tomo · tomo slice 22/43.0]
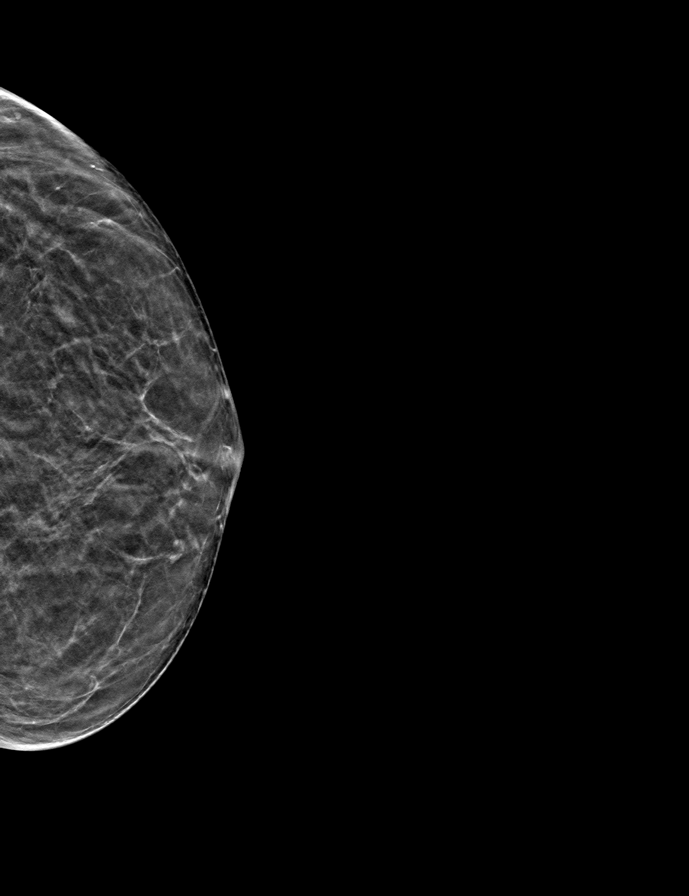

[L MLO tomo · tomo slice 21/41.0]
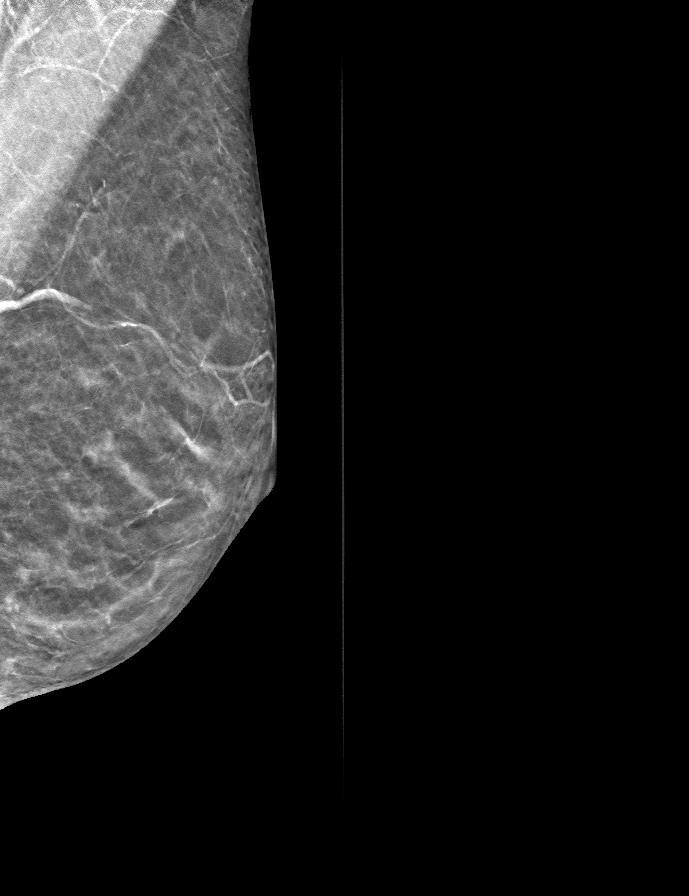

[R CC tomo · tomo slice 20/39.0]
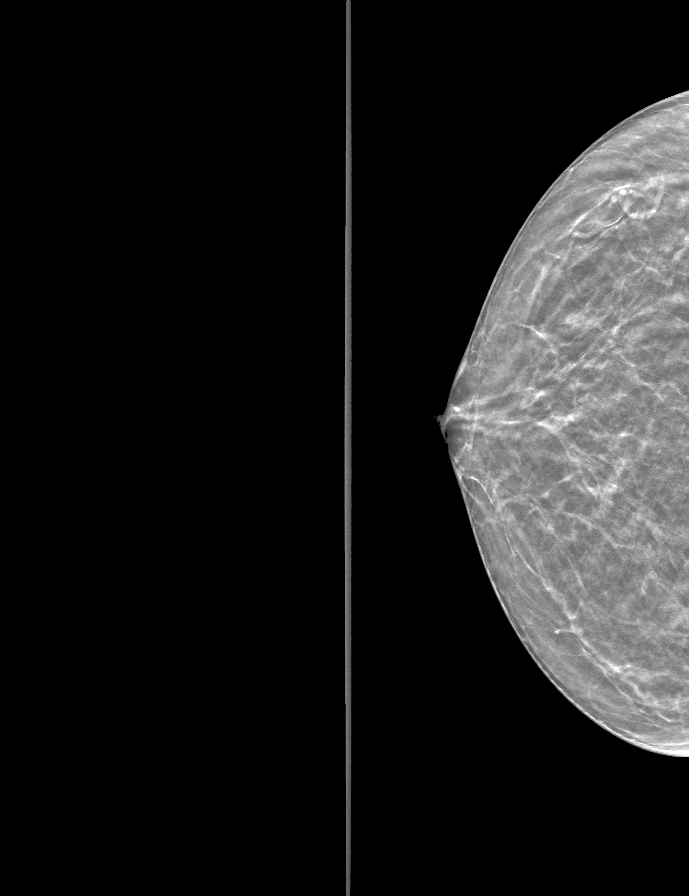

[9 of 24 positions shown; findings below may reference images not displayed]

ACR Breast Density Category b: There are scattered areas of
fibroglandular density.
FINDINGS: There are no findings suspicious for malignancy.
IMPRESSION: No mammographic evidence of malignancy. A result letter of this
screening mammogram will be mailed directly to the patient.

RECOMMENDATION:
Screening mammogram in one year. (Code:51-O-LD2)

BI-RADS CATEGORY  1: Negative.

## 2023-07-10 ENCOUNTER — Other Ambulatory Visit

## 2023-09-10 DIAGNOSIS — R5383 Other fatigue: Secondary | ICD-10-CM | POA: Diagnosis not present

## 2023-09-10 DIAGNOSIS — M545 Low back pain, unspecified: Secondary | ICD-10-CM | POA: Diagnosis not present

## 2023-09-10 DIAGNOSIS — G4700 Insomnia, unspecified: Secondary | ICD-10-CM | POA: Diagnosis not present

## 2023-10-09 DIAGNOSIS — Z961 Presence of intraocular lens: Secondary | ICD-10-CM | POA: Diagnosis not present

## 2023-10-09 DIAGNOSIS — H5213 Myopia, bilateral: Secondary | ICD-10-CM | POA: Diagnosis not present

## 2023-10-15 ENCOUNTER — Other Ambulatory Visit (HOSPITAL_BASED_OUTPATIENT_CLINIC_OR_DEPARTMENT_OTHER): Payer: Self-pay | Admitting: Obstetrics & Gynecology

## 2023-12-04 DIAGNOSIS — E7849 Other hyperlipidemia: Secondary | ICD-10-CM | POA: Diagnosis not present

## 2023-12-04 DIAGNOSIS — Z1212 Encounter for screening for malignant neoplasm of rectum: Secondary | ICD-10-CM | POA: Diagnosis not present

## 2023-12-11 DIAGNOSIS — G43909 Migraine, unspecified, not intractable, without status migrainosus: Secondary | ICD-10-CM | POA: Diagnosis not present

## 2023-12-11 DIAGNOSIS — R350 Frequency of micturition: Secondary | ICD-10-CM | POA: Diagnosis not present

## 2023-12-11 DIAGNOSIS — Z780 Asymptomatic menopausal state: Secondary | ICD-10-CM | POA: Diagnosis not present

## 2023-12-11 DIAGNOSIS — R590 Localized enlarged lymph nodes: Secondary | ICD-10-CM | POA: Diagnosis not present

## 2023-12-11 DIAGNOSIS — G4700 Insomnia, unspecified: Secondary | ICD-10-CM | POA: Diagnosis not present

## 2023-12-11 DIAGNOSIS — Z23 Encounter for immunization: Secondary | ICD-10-CM | POA: Diagnosis not present

## 2023-12-11 DIAGNOSIS — E785 Hyperlipidemia, unspecified: Secondary | ICD-10-CM | POA: Diagnosis not present

## 2023-12-11 DIAGNOSIS — E038 Other specified hypothyroidism: Secondary | ICD-10-CM | POA: Diagnosis not present

## 2023-12-11 DIAGNOSIS — N289 Disorder of kidney and ureter, unspecified: Secondary | ICD-10-CM | POA: Diagnosis not present

## 2023-12-11 DIAGNOSIS — Z0000 Encounter for general adult medical examination without abnormal findings: Secondary | ICD-10-CM | POA: Diagnosis not present

## 2023-12-11 DIAGNOSIS — Z1331 Encounter for screening for depression: Secondary | ICD-10-CM | POA: Diagnosis not present

## 2023-12-11 DIAGNOSIS — F4325 Adjustment disorder with mixed disturbance of emotions and conduct: Secondary | ICD-10-CM | POA: Diagnosis not present

## 2023-12-11 DIAGNOSIS — Z1211 Encounter for screening for malignant neoplasm of colon: Secondary | ICD-10-CM | POA: Diagnosis not present

## 2023-12-11 DIAGNOSIS — Z1339 Encounter for screening examination for other mental health and behavioral disorders: Secondary | ICD-10-CM | POA: Diagnosis not present

## 2023-12-11 DIAGNOSIS — R82998 Other abnormal findings in urine: Secondary | ICD-10-CM | POA: Diagnosis not present

## 2023-12-11 DIAGNOSIS — E559 Vitamin D deficiency, unspecified: Secondary | ICD-10-CM | POA: Diagnosis not present

## 2023-12-11 DIAGNOSIS — I7300 Raynaud's syndrome without gangrene: Secondary | ICD-10-CM | POA: Diagnosis not present

## 2023-12-11 DIAGNOSIS — R5383 Other fatigue: Secondary | ICD-10-CM | POA: Diagnosis not present

## 2023-12-11 DIAGNOSIS — M8580 Other specified disorders of bone density and structure, unspecified site: Secondary | ICD-10-CM | POA: Diagnosis not present

## 2023-12-12 DIAGNOSIS — N390 Urinary tract infection, site not specified: Secondary | ICD-10-CM | POA: Diagnosis not present

## 2023-12-27 ENCOUNTER — Other Ambulatory Visit (HOSPITAL_BASED_OUTPATIENT_CLINIC_OR_DEPARTMENT_OTHER): Payer: Self-pay | Admitting: Obstetrics & Gynecology

## 2024-01-16 ENCOUNTER — Other Ambulatory Visit: Payer: Self-pay | Admitting: Internal Medicine

## 2024-01-16 DIAGNOSIS — Z1231 Encounter for screening mammogram for malignant neoplasm of breast: Secondary | ICD-10-CM

## 2024-01-28 ENCOUNTER — Ambulatory Visit
Admission: RE | Admit: 2024-01-28 | Discharge: 2024-01-28 | Disposition: A | Source: Ambulatory Visit | Attending: Internal Medicine | Admitting: Internal Medicine

## 2024-01-28 DIAGNOSIS — Z1231 Encounter for screening mammogram for malignant neoplasm of breast: Secondary | ICD-10-CM

## 2025-01-11 ENCOUNTER — Ambulatory Visit (HOSPITAL_BASED_OUTPATIENT_CLINIC_OR_DEPARTMENT_OTHER): Payer: Self-pay | Admitting: Obstetrics & Gynecology
